# Patient Record
Sex: Female | Born: 1955 | Race: White | Hispanic: No | Smoking: Never smoker
Health system: Southern US, Community
[De-identification: ages and names within clinical notes are randomized; demographics above are authoritative.]

## PROBLEM LIST (undated history)

## (undated) DIAGNOSIS — M797 Fibromyalgia: Secondary | ICD-10-CM

## (undated) DIAGNOSIS — E785 Hyperlipidemia, unspecified: Secondary | ICD-10-CM

## (undated) DIAGNOSIS — I6521 Occlusion and stenosis of right carotid artery: Secondary | ICD-10-CM

## (undated) DIAGNOSIS — J309 Allergic rhinitis, unspecified: Secondary | ICD-10-CM

## (undated) DIAGNOSIS — K2970 Gastritis, unspecified, without bleeding: Secondary | ICD-10-CM

## (undated) DIAGNOSIS — M790 Rheumatism, unspecified: Secondary | ICD-10-CM

## (undated) DIAGNOSIS — N3010 Interstitial cystitis (chronic) without hematuria: Secondary | ICD-10-CM

## (undated) DIAGNOSIS — I319 Disease of pericardium, unspecified: Secondary | ICD-10-CM

## (undated) DIAGNOSIS — F419 Anxiety disorder, unspecified: Secondary | ICD-10-CM

## (undated) DIAGNOSIS — I341 Nonrheumatic mitral (valve) prolapse: Secondary | ICD-10-CM

## (undated) HISTORY — PX: CARDIAC CATHETERIZATION: SHX172

## (undated) HISTORY — DX: Anxiety disorder, unspecified: F41.9

## (undated) HISTORY — DX: Rheumatism, unspecified: M79.0

## (undated) HISTORY — DX: Allergic rhinitis, unspecified: J30.9

## (undated) HISTORY — DX: Occlusion and stenosis of right carotid artery: I65.21

## (undated) HISTORY — PX: ABDOMINAL HYSTERECTOMY: SHX81

---

## 1998-06-30 ENCOUNTER — Encounter: Payer: Self-pay | Admitting: Cardiovascular Disease

## 1998-06-30 ENCOUNTER — Inpatient Hospital Stay (HOSPITAL_COMMUNITY): Admission: EM | Admit: 1998-06-30 | Discharge: 1998-07-04 | Payer: Self-pay | Admitting: Emergency Medicine

## 1998-07-01 ENCOUNTER — Encounter: Payer: Self-pay | Admitting: Cardiovascular Disease

## 1998-07-02 ENCOUNTER — Encounter: Payer: Self-pay | Admitting: Cardiology

## 1998-07-03 ENCOUNTER — Encounter: Payer: Self-pay | Admitting: Internal Medicine

## 1998-09-16 ENCOUNTER — Encounter: Payer: Self-pay | Admitting: Internal Medicine

## 1998-09-16 ENCOUNTER — Emergency Department (HOSPITAL_COMMUNITY): Admission: EM | Admit: 1998-09-16 | Discharge: 1998-09-16 | Payer: Self-pay | Admitting: Emergency Medicine

## 1998-11-05 ENCOUNTER — Other Ambulatory Visit: Admission: RE | Admit: 1998-11-05 | Discharge: 1998-11-05 | Payer: Self-pay | Admitting: *Deleted

## 2000-09-23 ENCOUNTER — Other Ambulatory Visit: Admission: RE | Admit: 2000-09-23 | Discharge: 2000-09-23 | Payer: Self-pay | Admitting: Obstetrics and Gynecology

## 2001-02-17 ENCOUNTER — Ambulatory Visit (HOSPITAL_COMMUNITY): Admission: RE | Admit: 2001-02-17 | Discharge: 2001-02-17 | Payer: Self-pay | Admitting: Urology

## 2001-02-17 ENCOUNTER — Encounter (INDEPENDENT_AMBULATORY_CARE_PROVIDER_SITE_OTHER): Payer: Self-pay | Admitting: Specialist

## 2001-03-27 ENCOUNTER — Observation Stay (HOSPITAL_COMMUNITY): Admission: EM | Admit: 2001-03-27 | Discharge: 2001-03-28 | Payer: Self-pay | Admitting: Emergency Medicine

## 2002-05-09 ENCOUNTER — Ambulatory Visit (HOSPITAL_COMMUNITY): Admission: RE | Admit: 2002-05-09 | Discharge: 2002-05-09 | Payer: Self-pay | Admitting: *Deleted

## 2002-05-09 ENCOUNTER — Encounter: Payer: Self-pay | Admitting: *Deleted

## 2002-10-10 ENCOUNTER — Emergency Department (HOSPITAL_COMMUNITY): Admission: EM | Admit: 2002-10-10 | Discharge: 2002-10-10 | Payer: Self-pay | Admitting: Emergency Medicine

## 2002-10-10 ENCOUNTER — Encounter: Payer: Self-pay | Admitting: Emergency Medicine

## 2004-09-24 ENCOUNTER — Emergency Department (HOSPITAL_COMMUNITY): Admission: EM | Admit: 2004-09-24 | Discharge: 2004-09-24 | Payer: Self-pay | Admitting: Family Medicine

## 2005-10-27 ENCOUNTER — Emergency Department (HOSPITAL_COMMUNITY): Admission: EM | Admit: 2005-10-27 | Discharge: 2005-10-27 | Payer: Self-pay | Admitting: Emergency Medicine

## 2006-05-27 ENCOUNTER — Emergency Department (HOSPITAL_COMMUNITY): Admission: EM | Admit: 2006-05-27 | Discharge: 2006-05-27 | Payer: Self-pay | Admitting: Emergency Medicine

## 2006-11-07 ENCOUNTER — Emergency Department (HOSPITAL_COMMUNITY): Admission: EM | Admit: 2006-11-07 | Discharge: 2006-11-07 | Payer: Self-pay | Admitting: Emergency Medicine

## 2007-09-12 ENCOUNTER — Emergency Department (HOSPITAL_COMMUNITY): Admission: EM | Admit: 2007-09-12 | Discharge: 2007-09-12 | Payer: Self-pay | Admitting: Emergency Medicine

## 2008-01-01 ENCOUNTER — Emergency Department (HOSPITAL_COMMUNITY): Admission: EM | Admit: 2008-01-01 | Discharge: 2008-01-01 | Payer: Self-pay | Admitting: Emergency Medicine

## 2010-03-14 ENCOUNTER — Emergency Department (HOSPITAL_COMMUNITY): Payer: Self-pay

## 2010-03-14 ENCOUNTER — Encounter (HOSPITAL_COMMUNITY): Payer: Self-pay | Admitting: Radiology

## 2010-03-14 ENCOUNTER — Emergency Department (HOSPITAL_COMMUNITY)
Admission: EM | Admit: 2010-03-14 | Discharge: 2010-03-14 | Disposition: A | Discharge status: Home / Self Care | Payer: Self-pay | Attending: Emergency Medicine | Admitting: Emergency Medicine

## 2010-03-14 DIAGNOSIS — R109 Unspecified abdominal pain: Secondary | ICD-10-CM | POA: Insufficient documentation

## 2010-03-14 DIAGNOSIS — IMO0001 Reserved for inherently not codable concepts without codable children: Secondary | ICD-10-CM | POA: Insufficient documentation

## 2010-03-14 DIAGNOSIS — N3010 Interstitial cystitis (chronic) without hematuria: Secondary | ICD-10-CM | POA: Insufficient documentation

## 2010-03-14 DIAGNOSIS — R195 Other fecal abnormalities: Secondary | ICD-10-CM | POA: Insufficient documentation

## 2010-03-14 DIAGNOSIS — R300 Dysuria: Secondary | ICD-10-CM | POA: Insufficient documentation

## 2010-03-14 DIAGNOSIS — N12 Tubulo-interstitial nephritis, not specified as acute or chronic: Secondary | ICD-10-CM | POA: Insufficient documentation

## 2010-03-14 DIAGNOSIS — R110 Nausea: Secondary | ICD-10-CM | POA: Insufficient documentation

## 2010-03-14 DIAGNOSIS — K589 Irritable bowel syndrome without diarrhea: Secondary | ICD-10-CM | POA: Insufficient documentation

## 2010-03-14 LAB — CBC
MCH: 28.7 pg (ref 26.0–34.0)
MCHC: 33.3 g/dL (ref 30.0–36.0)
Platelets: 368 10*3/uL (ref 150–400)
RBC: 4.64 MIL/uL (ref 3.87–5.11)

## 2010-03-14 LAB — URINALYSIS, ROUTINE W REFLEX MICROSCOPIC
Bilirubin Urine: NEGATIVE
Nitrite: POSITIVE — AB
Specific Gravity, Urine: 1.017 (ref 1.005–1.030)
Urine Glucose, Fasting: NEGATIVE mg/dL
pH: 7 (ref 5.0–8.0)

## 2010-03-14 LAB — COMPREHENSIVE METABOLIC PANEL
ALT: 15 U/L (ref 0–35)
AST: 25 U/L (ref 0–37)
Albumin: 4.2 g/dL (ref 3.5–5.2)
CO2: 25 mEq/L (ref 19–32)
Calcium: 9.3 mg/dL (ref 8.4–10.5)
Sodium: 140 mEq/L (ref 135–145)
Total Protein: 7.4 g/dL (ref 6.0–8.3)

## 2010-03-14 LAB — URINE MICROSCOPIC-ADD ON

## 2010-03-14 LAB — DIFFERENTIAL
Basophils Relative: 1 % (ref 0–1)
Eosinophils Absolute: 0.2 10*3/uL (ref 0.0–0.7)
Monocytes Relative: 5 % (ref 3–12)
Neutrophils Relative %: 60 % (ref 43–77)

## 2010-03-14 LAB — LIPASE, BLOOD: Lipase: 29 U/L (ref 11–59)

## 2010-03-14 LAB — LACTIC ACID, PLASMA: Lactic Acid, Venous: 0.6 mmol/L (ref 0.5–2.2)

## 2010-03-14 MED ORDER — IOHEXOL 300 MG/ML  SOLN: 100.0000 mL | Freq: Once | INTRAMUSCULAR | Status: DC | PRN

## 2010-06-28 NOTE — Op Note (Signed)
[facility]  Patient:    Teresa Mcdaniel, Teresa Mcdaniel Visit Number: [id number] MRN: [id number]          Service Type: DSU Location: DAY Attending Physician:  Londell Moh Dictated by:   Jamison Neighbor, M.D. Proc. Date: 02/17/01 Admit Date:  02/17/2001   CC:         Meredith Staggers, M.D.  Vanessa P. Pennie Rushing, M.D.   Operative Report  SERVICE:  Urology.  PREOPERATIVE DIAGNOSES:  Chronic pelvic pain consistent with interstitial cystitis.  POSTOPERATIVE DIAGNOSES:  Chronic pelvic pain consistent with interstitial cystitis.  PROCEDURE:  Cystoscopy, urethral calibration, hydrodistention, Marcaine and Pyridium installation, Marcaine and Kenalog injection, bladder biopsy.  SURGEON:  Jamison Neighbor, M.D.  ANESTHESIA:  General.  COMPLICATIONS:  None.  DRAINS:  None.  BRIEF HISTORY:  This 55 year old female has been under the care of Dr. ______ for management of interstitial cystitis. The patient has also seen Dr. Bjorn Pippin. The patient has terrible periurethral pain and pelvic pain. She describes bleeding from the urethra. She seemed to have a flare-up after a complete gynecologic evaluation. She has put on an OLeary-Sant ______ score which was markedly elevated suggesting the patient may indeed have IC. The patient was scheduled for further evaluation with the plan that the patient could have a routinely scheduled hydrodistention. The patient developed increasing pain over the past week or so and has requested the hydrodistention be done on a more urgent basis. The patient has had improvement in her symptoms in the past with treatment of this nature. She realizes there is no guarantee that she will have any improvement with the hydrodistention. She describes such severe pain and she would like to go ahead and have this done and she gave full and informed consent.  DESCRIPTION OF PROCEDURE:  After successful induction of general anesthesia, the  patient was placed in the dorsal lithotomy position, prepped with Betadine and draped in the usual sterile fashion. Careful bimanual examination revealed no abnormalities of the urethra and no real cystocele, rectocele or enterocele. There were no masses on bimanual exam. The urethra was calibrated at 49 [place] with female urethral sounds with no signs of stenosis or stricture. The cystoscope was inserted, the bladder was carefully inspected and was free of any tumor or stones. Both ureteral orifices were normal in configuration and location. The patient was distended at a pressure of 100 cm of water for five minutes when the bladder was drained. The patient had a normal bladder capacity of 1150 cc. There were no glomerulations and no terminal blood tinge and it was felt this had a normal appearance. It is very possible that the patients appearance may have improved markedly due to the use of Elmiron. A biopsy was performed. The biopsy site was cauterized and a mixture of Marcaine and Pyridium was left within the bladder. Marcaine and Kenalog were injected periurethrally. A B&O suppository was inserted and she was given  Toradol and Zofran. The patients had problems with NSAIDs in the past but only when taken by mouth as it primarily causes GI upset and for that reason it was felt that it would be safe to give her Toradol. The patient will be sent home with Lorcet Plus. She already has urised and she will be given Cipro for possibly urethritis. The patient will be advised that this is a normal appearing ultrasound and review of old records by previous physicians will need to be done in order to determine if she  really does truly have interstitial cystitis. Dictated by:   Jamison Neighbor, M.D. Attending Physician:  Londell Moh DD:  02/17/01 TD:  02/17/01 Job: [id number]

## 2010-11-08 LAB — COMPREHENSIVE METABOLIC PANEL
ALT: 26
AST: 22
Albumin: 4.2
Alkaline Phosphatase: 91
BUN: 5 — ABNORMAL LOW
Chloride: 109
Potassium: 3.3 — ABNORMAL LOW
Sodium: 140
Total Protein: 6.9

## 2010-11-08 LAB — DIFFERENTIAL
Eosinophils Absolute: 0.3
Lymphs Abs: 5 — ABNORMAL HIGH
Monocytes Absolute: 0.5
Monocytes Relative: 5
Neutrophils Relative %: 39 — ABNORMAL LOW

## 2010-11-08 LAB — POCT CARDIAC MARKERS
CKMB, poc: <1 — ABNORMAL LOW
Myoglobin, poc: 54.8
Troponin i, poc: <0.05

## 2010-11-08 LAB — CBC
MCHC: 33.5
Platelets: 348
RDW: 12.5

## 2010-11-08 LAB — LIPASE, BLOOD: Lipase: 17

## 2010-11-21 LAB — DIFFERENTIAL
Basophils Absolute: 0
Lymphocytes Relative: 29
Monocytes Absolute: 0.4
Monocytes Relative: 4
Neutro Abs: 7.4

## 2010-11-21 LAB — URINALYSIS, ROUTINE W REFLEX MICROSCOPIC
Glucose, UA: NEGATIVE
Hgb urine dipstick: NEGATIVE
Ketones, ur: NEGATIVE
Protein, ur: NEGATIVE

## 2010-11-21 LAB — CBC
HCT: 37.9
MCV: 85.9
Platelets: 386
RDW: 13.3

## 2010-11-21 LAB — COMPREHENSIVE METABOLIC PANEL
AST: 24
Albumin: 3.8
Calcium: 9.3
Creatinine, Ser: 0.62
Total Protein: 6.8

## 2012-05-01 ENCOUNTER — Emergency Department (HOSPITAL_COMMUNITY)
Admission: EM | Admit: 2012-05-01 | Discharge: 2012-05-02 | Disposition: A | Discharge status: Home / Self Care | Payer: Self-pay | Attending: Emergency Medicine | Admitting: Emergency Medicine

## 2012-05-01 ENCOUNTER — Encounter (HOSPITAL_COMMUNITY): Payer: Self-pay | Admitting: Nurse Practitioner

## 2012-05-01 ENCOUNTER — Emergency Department (HOSPITAL_COMMUNITY): Payer: Self-pay

## 2012-05-01 DIAGNOSIS — Z23 Encounter for immunization: Secondary | ICD-10-CM | POA: Insufficient documentation

## 2012-05-01 DIAGNOSIS — S91009A Unspecified open wound, unspecified ankle, initial encounter: Secondary | ICD-10-CM | POA: Insufficient documentation

## 2012-05-01 DIAGNOSIS — W540XXA Bitten by dog, initial encounter: Secondary | ICD-10-CM | POA: Insufficient documentation

## 2012-05-01 DIAGNOSIS — S31109A Unspecified open wound of abdominal wall, unspecified quadrant without penetration into peritoneal cavity, initial encounter: Secondary | ICD-10-CM | POA: Insufficient documentation

## 2012-05-01 DIAGNOSIS — Y92009 Unspecified place in unspecified non-institutional (private) residence as the place of occurrence of the external cause: Secondary | ICD-10-CM | POA: Insufficient documentation

## 2012-05-01 DIAGNOSIS — Z8679 Personal history of other diseases of the circulatory system: Secondary | ICD-10-CM | POA: Insufficient documentation

## 2012-05-01 DIAGNOSIS — S81009A Unspecified open wound, unspecified knee, initial encounter: Secondary | ICD-10-CM | POA: Insufficient documentation

## 2012-05-01 DIAGNOSIS — Y9389 Activity, other specified: Secondary | ICD-10-CM | POA: Insufficient documentation

## 2012-05-01 HISTORY — DX: Nonrheumatic mitral (valve) prolapse: I34.1

## 2012-05-01 LAB — COMPREHENSIVE METABOLIC PANEL
ALT: 21 U/L (ref 0–35)
Alkaline Phosphatase: 82 U/L (ref 39–117)
CO2: 27 mEq/L (ref 19–32)
Chloride: 105 mEq/L (ref 96–112)
GFR calc Af Amer: >90 mL/min (ref >90)
GFR calc non Af Amer: >90 mL/min (ref >90)
Glucose, Bld: 122 mg/dL — ABNORMAL HIGH (ref 70–99)
Potassium: 3.6 mEq/L (ref 3.5–5.1)
Sodium: 145 mEq/L (ref 135–145)
Total Protein: 7.3 g/dL (ref 6.0–8.3)

## 2012-05-01 LAB — CBC WITH DIFFERENTIAL/PLATELET
Lymphocytes Relative: 34 % (ref 12–46)
Lymphs Abs: 4.2 10*3/uL — ABNORMAL HIGH (ref 0.7–4.0)
Neutro Abs: 7.5 10*3/uL (ref 1.7–7.7)
Neutrophils Relative %: 60 % (ref 43–77)
Platelets: 356 10*3/uL (ref 150–400)
RBC: 4.49 MIL/uL (ref 3.87–5.11)
WBC: 12.6 10*3/uL — ABNORMAL HIGH (ref 4.0–10.5)

## 2012-05-01 MED ORDER — AMOXICILLIN-POT CLAVULANATE 875-125 MG PO TABS: 1.0000 | ORAL_TABLET | Freq: Two times a day (BID) | ORAL | Status: DC

## 2012-05-01 MED ORDER — IOHEXOL 300 MG/ML  SOLN
80.0000 mL | Freq: Once | INTRAMUSCULAR | Status: AC | PRN
  Administered 2012-05-01: 80 mL via INTRAVENOUS

## 2012-05-01 MED ORDER — BACITRACIN 500 UNIT/GM EX OINT
1.0000 "application " | TOPICAL_OINTMENT | Freq: Two times a day (BID) | CUTANEOUS | Status: DC
  Filled 2012-05-01: qty 0.9

## 2012-05-01 MED ORDER — TETANUS-DIPHTH-ACELL PERTUSSIS 5-2.5-18.5 LF-MCG/0.5 IM SUSP
0.5000 mL | Freq: Once | INTRAMUSCULAR | Status: AC
  Administered 2012-05-01: 0.5 mL via INTRAMUSCULAR
  Filled 2012-05-01: qty 0.5

## 2012-05-01 MED ORDER — HYDROCODONE-ACETAMINOPHEN 5-325 MG PO TABS: 1.0000 | ORAL_TABLET | Freq: Four times a day (QID) | ORAL | Status: DC | PRN

## 2012-05-01 MED ORDER — ONDANSETRON 4 MG PO TBDP
4.0000 mg | ORAL_TABLET | Freq: Once | ORAL | Status: AC
Start: 2012-05-01 — End: 2012-05-01
  Administered 2012-05-01: 4 mg via ORAL
  Filled 2012-05-01: qty 1

## 2012-05-01 MED ORDER — MORPHINE SULFATE 4 MG/ML IJ SOLN
4.0000 mg | Freq: Once | INTRAMUSCULAR | Status: AC
  Administered 2012-05-01: 4 mg via INTRAVENOUS
  Filled 2012-05-01: qty 1

## 2012-05-01 MED ORDER — LORAZEPAM 1 MG PO TABS: 0.5000 mg | ORAL_TABLET | Freq: Four times a day (QID) | ORAL | Status: DC | PRN

## 2012-05-01 NOTE — ED Provider Notes (Signed)
History  This chart was scribed for non-physician practitioner Arthor Captain, PA-C working with Richardean Canal, MD, by Candelaria Stagers, ED Scribe. This patient was seen in room TR09C/TR09C and the patient's care was started at 7:45 PM   CSN: [id number]  Arrival date & time 05/01/12  4098   First MD Initiated Contact with Patient 05/01/12 1838      Chief Complaint  Patient presents with  . Animal Bite    The history is provided by the patient. No language interpreter was used.   Teresa Mcdaniel is a 57 y.o. female who presents to the Emergency Department complaining of a dog bite to her LLQ and left posterior calf after being surrounded by three dogs at a friends house about three hours ago.  She is experiencing associated abdominal pain and bruising.  The dogs are owned by a friend and she is unsure of their vaccines.  Tetanus is not up to date.  Pt has been applying ice and has the area covered.  Animal control has been contacted.  Bleeding is controlled.    Past Medical History  Diagnosis Date  . Mitral valve prolapse     History reviewed. No pertinent past surgical history.  History reviewed. No pertinent family history.  History  Substance Use Topics  . Smoking status: Never Smoker   . Smokeless tobacco: Not on file  . Alcohol Use: No    OB History   Grav Para Term Preterm Abortions TAB SAB Ect Mult Living                  Review of Systems  Gastrointestinal: Positive for abdominal pain.  Skin: Positive for wound (animal bite to LLQ and left posterior calf).  All other systems reviewed and are negative.    Allergies  Amoxicillin; Nsaids; and Sulfa antibiotics  Home Medications   Current Outpatient Rx  Name  Route  Sig  Dispense  Refill  . naproxen sodium (ANAPROX) 220 MG tablet   Oral   Take 220 mg by mouth daily as needed (for pain).           BP 143/103  Pulse 110  Temp(Src) 98.1 F (36.7 C) (Oral)  Resp 16  SpO2 98%  Physical Exam  Nursing note  and vitals reviewed. Constitutional: She is oriented to person, place, and time. She appears well-developed and well-nourished. No distress.  HENT:  Head: Normocephalic and atraumatic.  Eyes: EOM are normal.  Neck: Neck supple. No tracheal deviation present.  Cardiovascular: Normal rate.   Pulmonary/Chest: Effort normal. No respiratory distress.  Abdominal:  2 small puncture wounds and abrasions to LLQ with ecchymosis, swelling, and rebound.  Exquisite tenderness to palpation of LLQ.  Peritoneal signs to left heel strike.   Musculoskeletal: Normal range of motion.  One small puncture wound to posterior left calf.   Neurological: She is alert and oriented to person, place, and time.  Skin: Skin is warm and dry.  Psychiatric: She has a normal mood and affect. Her behavior is normal.    ED Course  Procedures  DIAGNOSTIC STUDIES: Oxygen Saturation is 98% on room air, normal by my interpretation.    COORDINATION OF CARE:  7:48 PM Discussed course of care with pt which includes cleaning wound.  Pt understands and agrees.   8:02 PM Consult with  Richardean Canal, MD.  Will order CT abdomen.  11:19 PM Discussed CT with pt.  Discussed need for rabies series only if animal control reports  indicate so.  Pt understands and agrees.  Cleaned the wounds with copious amounts of saline solution and applied clean dry dressing.    Labs Reviewed  CBC WITH DIFFERENTIAL - Abnormal; Notable for the following:    WBC 12.6 (*)    Lymphs Abs 4.2 (*)    All other components within normal limits  COMPREHENSIVE METABOLIC PANEL - Abnormal; Notable for the following:    Glucose, Bld 122 (*)    Total Bilirubin 0.2 (*)    All other components within normal limits   Ct Abdomen Pelvis W Contrast  05/01/2012  *RADIOLOGY REPORT*  Clinical Data: 57 year old female with abdominal/pelvic injury with dog bite.  CT ABDOMEN AND PELVIS WITH CONTRAST  Technique:  Multidetector CT imaging of the abdomen and pelvis was  performed following the standard protocol during bolus administration of intravenous contrast.  Contrast: 80mL OMNIPAQUE IOHEXOL 300 MG/ML  SOLN  Comparison: 03/14/2010 CT  Findings: Subcutaneous inflammation and gas within the anterior left pelvis is compatible with dog bite by history.  There is no evidence of intra-abdominal or intrapelvic extension of this inflammation or gas.  The liver, gallbladder, adrenal glands, kidneys, pancreas and spleen are unremarkable.  No free fluid, enlarged lymph nodes, biliary dilation or abdominal aortic aneurysm identified.  The bowel and bladder are unremarkable. The patient is status post hysterectomy.  No acute or suspicious bony abnormalities are identified.  IMPRESSION: Subcutaneous inflammation and gas within the anterior left pelvis compatible with dog bite by history.  No evidence of intra- abdominal or intrapelvic abnormality.   Original Report Authenticated By: Harmon Pier, M.D.      1. Bite from dog, initial encounter       MDM  Patient with dog bite and exquisite abdominal tenderness. I have ordered CT to r/o bowel injury. Pain control initiated.  Dr. Silverio Lay notified and agrees. Patient seen in shared visit.  11:00 PM Filed Vitals:   05/01/12 1832 05/02/12 0054  BP: 143/103 126/76  Pulse: 110 72  Temp: 98.1 F (36.7 C) 97.1 F (36.2 C)  TempSrc: Oral Oral  Resp: 16 18  SpO2: 98% 95%   patien t CT scan negative for any intrabdominal injury. Superficial puncture sites have been flussed thoroughly with sterile saline.  No signs of compartment syndrome in teh left calf.  Dog owners contacted and animals up to date on  Vaccinations. Animal control notified, Tdap updated.  Will cover for infection with augmentin. Return precautions discussed. Will discharge with anxiolytic and pain meds.  I personally performed the services described in this documentation, which was scribed in my presence. The recorded information has been reviewed and is  accurate.          Arthor Captain, PA-C 05/02/12 [phone]

## 2012-05-01 NOTE — ED Notes (Signed)
Pt anxious, pt states, "I wish someone will listen to my heart, because I screamed for about 3 mins before the owners came & got me."

## 2012-05-01 NOTE — ED Notes (Signed)
Called animal control but no response as its out of office hours.

## 2012-05-01 NOTE — ED Notes (Signed)
Pt reports a dog ran up and bit her LLQ and L posterior calf. Bleeding stopped now. C/o severe pain at site. Has been applying ice to abd. Dog is owned by a family friend and remains with the owner at this time. Animal control has not been contacted.

## 2012-05-02 MED ORDER — BACITRACIN ZINC 500 UNIT/GM EX OINT
TOPICAL_OINTMENT | Freq: Two times a day (BID) | CUTANEOUS | Status: DC
  Administered 2012-05-02: 01:00:00 via TOPICAL
  Filled 2012-05-02: qty 15

## 2012-05-02 MED ORDER — ACETAMINOPHEN 325 MG PO TABS
650.0000 mg | ORAL_TABLET | Freq: Once | ORAL | Status: AC
  Administered 2012-05-02: 650 mg via ORAL
  Filled 2012-05-02: qty 2

## 2012-05-02 NOTE — ED Notes (Signed)
Pt discharged.Vital signs stable and GCS 15 

## 2012-05-02 NOTE — ED Provider Notes (Signed)
Medical screening examination/treatment/procedure(s) were conducted as a shared visit with non-physician practitioner(s) and myself.  I personally evaluated the patient during the encounter  Teresa Mcdaniel is a 57 y.o. female here with dog bite. Bit by neighbor's dog. Tetanus updated in the ED. She was bitten on L leg and L sided lower abdomen. Mildly tender LLQ but the wound appeared superficial. CT ab/pel showed no abscess. She is d/c home on augmentin. Animal control notified by PA.    Richardean Canal, MD 05/02/12 [phone]

## 2012-05-03 ENCOUNTER — Telehealth (HOSPITAL_COMMUNITY): Payer: Self-pay | Admitting: *Deleted

## 2012-05-03 NOTE — ED Notes (Signed)
Patient called wanting clarification on medication, Current scriber informed her that abx given was Augmentin.Patient felt relieved.

## 2012-05-17 ENCOUNTER — Emergency Department (HOSPITAL_COMMUNITY)
Admission: EM | Admit: 2012-05-17 | Discharge: 2012-05-17 | Disposition: A | Discharge status: Home / Self Care | Payer: Self-pay | Attending: Emergency Medicine | Admitting: Emergency Medicine

## 2012-05-17 ENCOUNTER — Encounter (HOSPITAL_COMMUNITY): Payer: Self-pay | Admitting: Nurse Practitioner

## 2012-05-17 ENCOUNTER — Emergency Department (HOSPITAL_COMMUNITY): Payer: Self-pay

## 2012-05-17 DIAGNOSIS — Z4801 Encounter for change or removal of surgical wound dressing: Secondary | ICD-10-CM | POA: Insufficient documentation

## 2012-05-17 DIAGNOSIS — Z79899 Other long term (current) drug therapy: Secondary | ICD-10-CM | POA: Insufficient documentation

## 2012-05-17 DIAGNOSIS — Z8719 Personal history of other diseases of the digestive system: Secondary | ICD-10-CM | POA: Insufficient documentation

## 2012-05-17 DIAGNOSIS — R1032 Left lower quadrant pain: Secondary | ICD-10-CM | POA: Insufficient documentation

## 2012-05-17 DIAGNOSIS — Z8679 Personal history of other diseases of the circulatory system: Secondary | ICD-10-CM | POA: Insufficient documentation

## 2012-05-17 DIAGNOSIS — Z5189 Encounter for other specified aftercare: Secondary | ICD-10-CM

## 2012-05-17 DIAGNOSIS — R110 Nausea: Secondary | ICD-10-CM | POA: Insufficient documentation

## 2012-05-17 HISTORY — DX: Interstitial cystitis (chronic) without hematuria: N30.10

## 2012-05-17 LAB — CBC WITH DIFFERENTIAL/PLATELET
Basophils Absolute: 0 10*3/uL (ref 0.0–0.1)
Basophils Relative: 0 % (ref 0–1)
HCT: 37.3 % (ref 36.0–46.0)
MCHC: 34.3 g/dL (ref 30.0–36.0)
Monocytes Absolute: 0.4 10*3/uL (ref 0.1–1.0)
Neutro Abs: 4.9 10*3/uL (ref 1.7–7.7)
RDW: 13.4 % (ref 11.5–15.5)

## 2012-05-17 MED ORDER — ONDANSETRON 4 MG PO TBDP: 4.0000 mg | ORAL_TABLET | Freq: Three times a day (TID) | ORAL | Status: DC | PRN

## 2012-05-17 MED ORDER — TRAMADOL HCL 50 MG PO TABS: 50.0000 mg | ORAL_TABLET | Freq: Four times a day (QID) | ORAL | Status: DC | PRN

## 2012-05-17 MED ORDER — IOHEXOL 300 MG/ML  SOLN
100.0000 mL | Freq: Once | INTRAMUSCULAR | Status: AC | PRN
  Administered 2012-05-17: 80 mL via INTRAVENOUS

## 2012-05-17 NOTE — ED Notes (Signed)
Pt reports dog bite to abd on 3/22, reports she took a course of oral antibiotics and has been cleaning with bacitracin but is having increasing pain and swelling at site since.

## 2012-05-17 NOTE — ED Notes (Signed)
Patient is alert and orientedx4.  Patient was explained discharge instructions and they understood them with no questions.   

## 2012-05-17 NOTE — ED Notes (Signed)
Patient is resting comfortably. Requested water and it was given to her.

## 2012-05-17 NOTE — ED Provider Notes (Signed)
History     CSN: [id number]  Arrival date & time 05/17/12  1435   First MD Initiated Contact with Patient 05/17/12 1458      Chief Complaint  Patient presents with  . Wound Check    (Consider location/radiation/quality/duration/timing/severity/associated sxs/prior treatment) HPI Comments: Pt presenting to ED for wound check from initial dog bite on 05/01/12.  Has completed her course of augmentin and has been cleaning the wounds daily and applying bacitracin.  States she continues to have pain at the puncture sites and some intermittent nausea.  Has not taken any of her pain medications since initial injury.  Pain is worse with movement or lifting heavy objects.  Patient is a 57 y.o. female presenting with wound check. The history is provided by the patient.  Wound Check    Past Medical History  Diagnosis Date  . Mitral valve prolapse   . Interstitial cystitis     History reviewed. No pertinent past surgical history.  History reviewed. No pertinent family history.  History  Substance Use Topics  . Smoking status: Never Smoker   . Smokeless tobacco: Not on file  . Alcohol Use: No    OB History   Grav Para Term Preterm Abortions TAB SAB Ect Mult Living                  Review of Systems  Skin: Positive for wound.  All other systems reviewed and are negative.    Allergies  Nsaids; Tetracyclines & related; and Sulfa antibiotics  Home Medications   Current Outpatient Rx  Name  Route  Sig  Dispense  Refill  . amoxicillin-clavulanate (AUGMENTIN) 875-125 MG per tablet   Oral   Take 1 tablet by mouth every 12 (twelve) hours.   14 tablet   0   . HYDROcodone-acetaminophen (NORCO) 5-325 MG per tablet   Oral   Take 1-2 tablets by mouth every 6 (six) hours as needed for pain.   20 tablet   0   . LORazepam (ATIVAN) 1 MG tablet   Oral   Take 0.5-1 tablets (0.5-1 mg total) by mouth every 6 (six) hours as needed for anxiety.   15 tablet   0   . naproxen sodium  (ANAPROX) 220 MG tablet   Oral   Take 220 mg by mouth daily as needed (for pain).           BP 121/74  Pulse 66  Temp(Src) 98.4 F (36.9 C) (Oral)  Resp 16  SpO2 97%  Physical Exam  Nursing note and vitals reviewed. Constitutional: She is oriented to person, place, and time. She appears well-developed and well-nourished.  HENT:  Head: Normocephalic and atraumatic.  Mouth/Throat: Oropharynx is clear and moist.  Eyes: Conjunctivae and EOM are normal. Pupils are equal, round, and reactive to light.  Neck: Normal range of motion.  Cardiovascular: Normal rate, regular rhythm and normal heart sounds.   Pulmonary/Chest: Effort normal and breath sounds normal.  Abdominal: Soft. Bowel sounds are normal.    Puncture wounds healing nicely without surrounding swelling, bruising, erythema or evidence of cellulitis  Musculoskeletal:       Legs: Puncture wound healing nicely, no surrounding swelling, erythema, bruising, or evidence of cellulitis  Neurological: She is alert and oriented to person, place, and time.  Skin: Skin is warm and dry.  Psychiatric: She has a normal mood and affect.    ED Course  Procedures (including critical care time)  Labs Reviewed  CBC WITH DIFFERENTIAL -  Abnormal; Notable for the following:    Lymphocytes Relative 47 (*)    Lymphs Abs 4.9 (*)    All other components within normal limits   Ct Abdomen Pelvis W Contrast  05/17/2012  *RADIOLOGY REPORT*  Clinical Data: Dog bite to the left lower quadrant abdominal wall on 05/01/2012.  Increasing pain and swelling at the site of injury.  CT ABDOMEN AND PELVIS WITH CONTRAST  Technique:  Multidetector CT imaging of the abdomen and pelvis was performed following the standard protocol during bolus administration of intravenous contrast.  Contrast: 80mL OMNIPAQUE IOHEXOL 300 MG/ML SOLN  Comparison: Prior CT on 05/01/2012.  Findings: At the level of injury, subcutaneous air has resolved. There remains some mild  inflammatory stranding in the subcutaneous fat of the left lower quadrant.  No foreign body is identified. There is no evidence of focal abscess.  The rest of the study demonstrates stable appearance to the abdominal and pelvic contents without evidence of acute abnormality.  Bowel remains normal.  No free fluid is identified. No evidence of hernia.  IMPRESSION: By CT, the subcutaneous injury at the level of the left lower quadrant dog bite appears improved with resolution of subcutaneous gas.  Mild stranding remains present in the subcutaneous fat.  No focal abscess or soft tissue foreign body is identified.   Original Report Authenticated By: Irish Lack, M.D.      1. Visit for wound check       MDM   Pt presents for wound check following dog bite on 3/22.  Completed course of augmentin but continues to have pain at the puncture sites and intermittent nausea.  Has not taken any of her pain medications since initial encounter.  Wounds are healing nicely without surrounding erythema, swelling, bruising, or evidence of cellulitis.  Pt is still concerned that she has gross infection- would like blood work done.  I will obtain CBC.  CBC WNL.  Pt still requesting additional evaluation.  Evaluated by Dr. Manus Gunning.  CT obtained at pts request- again negative for acute processes.  Rx tramadol and zofran.  Continue with wound care. Return precautions advised.       Garlon Hatchet, PA-C 05/18/12 1132

## 2012-05-17 NOTE — ED Notes (Signed)
Wounds on abdomen and left lower leg appear to be well healed. Pt c/o continued abd pain. States feels nausea and chills at times.

## 2012-05-19 NOTE — ED Provider Notes (Signed)
Medical screening examination/treatment/procedure(s) were conducted as a shared visit with non-physician practitioner(s) and myself.  I personally evaluated the patient during the encounter  Recheck of dog bite to abdomen sustained 3/22.  Healing well. No evidence of cellulitis or abscess.  Glynn Octave, MD 05/19/12 1018

## 2012-09-23 ENCOUNTER — Observation Stay (HOSPITAL_COMMUNITY)
Admission: EM | Admit: 2012-09-23 | Discharge: 2012-09-25 | Disposition: A | Discharge status: Home / Self Care | Payer: 59 | Attending: Internal Medicine | Admitting: Internal Medicine

## 2012-09-23 ENCOUNTER — Encounter (HOSPITAL_COMMUNITY): Payer: Self-pay | Admitting: *Deleted

## 2012-09-23 ENCOUNTER — Emergency Department (HOSPITAL_COMMUNITY): Payer: Self-pay

## 2012-09-23 DIAGNOSIS — I319 Disease of pericardium, unspecified: Secondary | ICD-10-CM | POA: Diagnosis present

## 2012-09-23 DIAGNOSIS — I059 Rheumatic mitral valve disease, unspecified: Secondary | ICD-10-CM

## 2012-09-23 DIAGNOSIS — I341 Nonrheumatic mitral (valve) prolapse: Secondary | ICD-10-CM | POA: Diagnosis present

## 2012-09-23 DIAGNOSIS — R079 Chest pain, unspecified: Secondary | ICD-10-CM

## 2012-09-23 DIAGNOSIS — R6884 Jaw pain: Secondary | ICD-10-CM | POA: Insufficient documentation

## 2012-09-23 DIAGNOSIS — Z79899 Other long term (current) drug therapy: Secondary | ICD-10-CM | POA: Insufficient documentation

## 2012-09-23 DIAGNOSIS — R072 Precordial pain: Principal | ICD-10-CM | POA: Insufficient documentation

## 2012-09-23 HISTORY — DX: Disease of pericardium, unspecified: I31.9

## 2012-09-23 HISTORY — DX: Gastritis, unspecified, without bleeding: K29.70

## 2012-09-23 LAB — CBC WITH DIFFERENTIAL/PLATELET
Basophils Relative: 0 % (ref 0–1)
Eosinophils Absolute: 0.3 10*3/uL (ref 0.0–0.7)
MCH: 29.7 pg (ref 26.0–34.0)
MCHC: 34.2 g/dL (ref 30.0–36.0)
Neutrophils Relative %: 52 % (ref 43–77)
Platelets: 334 10*3/uL (ref 150–400)
RDW: 13.4 % (ref 11.5–15.5)

## 2012-09-23 LAB — POCT I-STAT, CHEM 8
Calcium, Ion: 1.2 mmol/L (ref 1.12–1.23)
Chloride: 105 mEq/L (ref 96–112)
HCT: 41 % (ref 36.0–46.0)
Hemoglobin: 13.9 g/dL (ref 12.0–15.0)
TCO2: 25 mmol/L (ref 0–100)

## 2012-09-23 LAB — URINALYSIS, ROUTINE W REFLEX MICROSCOPIC
Glucose, UA: NEGATIVE mg/dL
Hgb urine dipstick: NEGATIVE
Specific Gravity, Urine: 1.008 (ref 1.005–1.030)
Urobilinogen, UA: 0.2 mg/dL (ref 0.0–1.0)
pH: 6.5 (ref 5.0–8.0)

## 2012-09-23 LAB — URINE MICROSCOPIC-ADD ON

## 2012-09-23 LAB — POCT I-STAT TROPONIN I
Troponin i, poc: 0.01 ng/mL (ref 0.00–0.08)
Troponin i, poc: 0 ng/mL (ref 0.00–0.08)

## 2012-09-23 MED ORDER — KETOROLAC TROMETHAMINE 30 MG/ML IJ SOLN
30.0000 mg | Freq: Once | INTRAMUSCULAR | Status: AC
  Administered 2012-09-23: 30 mg via INTRAVENOUS
  Filled 2012-09-23: qty 1

## 2012-09-23 MED ORDER — NITROGLYCERIN 2 % TD OINT
1.0000 [in_us] | TOPICAL_OINTMENT | Freq: Once | TRANSDERMAL | Status: AC
  Administered 2012-09-23: 1 [in_us] via TOPICAL
  Filled 2012-09-23: qty 1

## 2012-09-23 MED ORDER — ONDANSETRON HCL 4 MG/2ML IJ SOLN
4.0000 mg | Freq: Once | INTRAMUSCULAR | Status: AC
  Administered 2012-09-23: 4 mg via INTRAVENOUS
  Filled 2012-09-23: qty 2

## 2012-09-23 MED ORDER — ACETAMINOPHEN 325 MG PO TABS
650.0000 mg | ORAL_TABLET | Freq: Once | ORAL | Status: AC
  Administered 2012-09-23: 650 mg via ORAL
  Filled 2012-09-23: qty 2

## 2012-09-23 MED ORDER — LORAZEPAM 2 MG/ML IJ SOLN
1.0000 mg | Freq: Once | INTRAMUSCULAR | Status: AC
  Administered 2012-09-23: 1 mg via INTRAVENOUS
  Filled 2012-09-23: qty 1

## 2012-09-23 MED ORDER — NITROGLYCERIN 0.4 MG SL SUBL
0.4000 mg | SUBLINGUAL_TABLET | SUBLINGUAL | Status: DC | PRN
  Administered 2012-09-23 (×2): 0.4 mg via SUBLINGUAL
  Filled 2012-09-23: qty 25

## 2012-09-23 MED ORDER — ENOXAPARIN SODIUM 80 MG/0.8ML ~~LOC~~ SOLN
1.0000 mg/kg | Freq: Two times a day (BID) | SUBCUTANEOUS | Status: DC
  Administered 2012-09-23: 70 mg via SUBCUTANEOUS
  Filled 2012-09-23 (×3): qty 0.8

## 2012-09-23 MED ORDER — ASPIRIN 81 MG PO CHEW
324.0000 mg | CHEWABLE_TABLET | Freq: Once | ORAL | Status: AC
  Administered 2012-09-23: 324 mg via ORAL
  Filled 2012-09-23: qty 4

## 2012-09-23 MED ORDER — GI COCKTAIL ~~LOC~~
30.0000 mL | Freq: Once | ORAL | Status: AC
  Administered 2012-09-23: 30 mL via ORAL
  Filled 2012-09-23: qty 30

## 2012-09-23 MED ORDER — MORPHINE SULFATE 2 MG/ML IJ SOLN
2.0000 mg | INTRAMUSCULAR | Status: DC | PRN
  Administered 2012-09-23: 2 mg via INTRAVENOUS
  Filled 2012-09-23: qty 1

## 2012-09-23 NOTE — ED Notes (Signed)
Pt with hx of mitral valve prolapse to ED c/o L jaw pain, arm pain and scapular pain for several weeks.  Last night the pain increased to where she could not ignore it.  Also c/o headache.

## 2012-09-23 NOTE — H&P (Signed)
Chief Complaint:  cp  HPI: 57 yo female with several episodes of sscp with exertion for several weeks now that would spontaneously resolve comes in with an episode today that started radiating to left jaw/neck.  She was given ntg sl in ED which relieved her pain somewhat.  Associated sob.  No n/v.  No fevers.  No le edema or swelling.  No prior h/o cad.  Her pain is down to 2/10 now, has ntg paste on.  Anxious.  Review of Systems:  Positive and negative as per HPI otherwise all other systems are negative  Past Medical History: Past Medical History  Diagnosis Date  . Mitral valve prolapse   . Interstitial cystitis   . Gastritis   . Pericarditis    Past Surgical History  Procedure Laterality Date  . Abdominal hysterectomy      Medications: Prior to Admission medications   Medication Sig Start Date End Date Taking? Authorizing Provider  Ascorbic Acid (VITAMIN C PO) Take 1 tablet by mouth daily.   Yes Historical Provider, MD  b complex vitamins tablet Take 1 tablet by mouth daily.   Yes Historical Provider, MD  Cholecalciferol (VITAMIN D-3 PO) Take 1 tablet by mouth daily.   Yes Historical Provider, MD  GARLIC PO Take 1 tablet by mouth daily.   Yes Historical Provider, MD  IRON PO Take 1 tablet by mouth daily.   Yes Historical Provider, MD  loratadine (CLARITIN) 10 MG tablet Take 10 mg by mouth daily as needed for allergies.   Yes Historical Provider, MD  naproxen sodium (ANAPROX) 220 MG tablet Take 220 mg by mouth daily as needed (for pain).   Yes Historical Provider, MD  ranitidine (ZANTAC) 150 MG tablet Take 300 mg by mouth daily.   Yes Historical Provider, MD    Allergies:   Allergies  Allergen Reactions  . Codeine Nausea Only  . Levaquin [Levofloxacin In D5w]   . Lodine [Etodolac]   . Nsaids Other (See Comments)    Stomach problems  . Tetracyclines & Related Swelling  . Sulfa Antibiotics Swelling    Social History:  reports that she has never smoked. She does not  have any smokeless tobacco history on file. She reports that she does not drink alcohol or use illicit drugs.  Family History: Father mi in 43s.  Sibling with mi in 38s.  Physical Exam: Filed Vitals:   09/23/12 2000 09/23/12 2015 09/23/12 2030 09/23/12 2045  BP: 107/66 117/69 105/51 114/70  Pulse: 65 69 67 62  Temp:      TempSrc:      Resp: 13 14 21 16   Height:      Weight:      SpO2: 96% 94% 98% 96%   General appearance: alert, cooperative and no distress Head: Normocephalic, without obvious abnormality, atraumatic Eyes: negative Nose: Nares normal. Septum midline. Mucosa normal. No drainage or sinus tenderness. Neck: no JVD and supple, symmetrical, trachea midline Lungs: clear to auscultation bilaterally Heart: regular rate and rhythm, S1, S2 normal, no murmur, click, rub or gallop Abdomen: soft, non-tender; bowel sounds normal; no masses,  no organomegaly Extremities: extremities normal, atraumatic, no cyanosis or edema Pulses: 2+ and symmetric Skin: Skin color, texture, turgor normal. No rashes or lesions Neurologic: Grossly normal  Labs on Admission:   Recent Labs  09/23/12 1618  NA 143  K 3.8  CL 105  GLUCOSE 94  BUN 9  CREATININE 0.80    Recent Labs  09/23/12 1618 09/23/12 1856  WBC  --  12.7*  NEUTROABS  --  6.7  HGB 13.9 13.0  HCT 41.0 38.0  MCV  --  87.0  PLT  --  334   Radiological Exams on Admission: Dg Chest 2 View  09/23/2012   *RADIOLOGY REPORT*  Clinical Data: Chest pain, jaw pain  CHEST - 2 VIEW  Comparison: 09/12/2007  Findings: Cardiomediastinal silhouette is stable.  No acute infiltrate or pleural effusion.  No pulmonary edema.  Mild thoracic dextroscoliosis.  IMPRESSION: No active disease.  No significant change.   Original Report Authenticated By: Natasha Mead, M.D.    Assessment/Plan  57 yo female with sscp with strong family h/o CAD Principal Problem:   Chest pain radiating to jaw Active Problems:   Mitral valve prolapse   h/o  Pericarditis  Romi.  ntg paste.  Full dose lovenox.  Morphine prn.  Asa.  Echo in am.  Needs full cardiac stress testing at some point for complete w/u.  obs on tele.  Full code.  Iver Miklas A 09/23/2012, 9:27 PM

## 2012-09-23 NOTE — ED Notes (Signed)
Phlebotomy informed we need an I-stat troponin drawn on the pt.

## 2012-09-23 NOTE — ED Notes (Signed)
Pt began to have CP again after receiving morphine. RN promoted comfort by repositioning pt and encouraging deep breathing. Pain did not resolve. PA notified.

## 2012-09-23 NOTE — ED Provider Notes (Signed)
CSN: [id number]     Arrival date & time 09/23/12  1544 History     First MD Initiated Contact with Patient 09/23/12 1754     Chief Complaint  Patient presents with  . Jaw Pain   (Consider location/radiation/quality/duration/timing/severity/associated sxs/prior Treatment) HPI Comments: Patient reports central chest pain with radiation into her left neck and left jaw and pain in her left scapula that began this afternoon at 2pm.  Has also had left sided headache. States over the past two weeks she has had intermittent central chest pain and SOB that occurs only with exertion. Pt has had unrelated intermittent abdominal pain since March since a dog attacked her.  Denies fever, chills, cough, hemoptysis, recent illness.  She notes she has had increased amount of stress.    The history is provided by the patient.    Past Medical History  Diagnosis Date  . Mitral valve prolapse   . Interstitial cystitis   . Gastritis   . Pericarditis    Past Surgical History  Procedure Laterality Date  . Abdominal hysterectomy     No family history on file. History  Substance Use Topics  . Smoking status: Never Smoker   . Smokeless tobacco: Not on file  . Alcohol Use: No   OB History   Grav Para Term Preterm Abortions TAB SAB Ect Mult Living                 Review of Systems  Constitutional: Negative for fever and chills.  Respiratory: Positive for shortness of breath. Negative for cough.   Cardiovascular: Positive for chest pain.  Gastrointestinal: Positive for abdominal pain (chronic, unchanged since March.  3 negative CTs). Negative for nausea, vomiting and diarrhea.  Genitourinary: Negative for dysuria, urgency and frequency.  All other systems reviewed and are negative.    Allergies  Codeine; Levaquin; Lodine; Nsaids; Tetracyclines & related; and Sulfa antibiotics  Home Medications   Current Outpatient Rx  Name  Route  Sig  Dispense  Refill  . Ascorbic Acid (VITAMIN C PO)  Oral   Take 1 tablet by mouth daily.         Marland Kitchen b complex vitamins tablet   Oral   Take 1 tablet by mouth daily.         . Cholecalciferol (VITAMIN D-3 PO)   Oral   Take 1 tablet by mouth daily.         Marland Kitchen GARLIC PO   Oral   Take 1 tablet by mouth daily.         . IRON PO   Oral   Take 1 tablet by mouth daily.         Marland Kitchen loratadine (CLARITIN) 10 MG tablet   Oral   Take 10 mg by mouth daily as needed for allergies.         . naproxen sodium (ANAPROX) 220 MG tablet   Oral   Take 220 mg by mouth daily as needed (for pain).         . ranitidine (ZANTAC) 150 MG tablet   Oral   Take 300 mg by mouth daily.          BP 125/68  Pulse 66  Temp(Src) 98.1 F (36.7 C) (Oral)  Resp 12  Ht 5\' 5"  (1.651 m)  Wt 157 lb (71.215 kg)  BMI 26.13 kg/m2  SpO2 100% Physical Exam  Nursing note and vitals reviewed. Constitutional: She appears well-developed and well-nourished. No distress.  HENT:  Head: Normocephalic and atraumatic.  Neck: Neck supple.  Cardiovascular: Normal rate and regular rhythm.   Pulmonary/Chest: Effort normal and breath sounds normal. No respiratory distress. She has no wheezes. She has no rales.  Abdominal: Soft. She exhibits no distension. There is tenderness in the suprapubic area. There is no rebound and no guarding.  Neurological: She is alert.  Skin: She is not diaphoretic.    ED Course   Procedures (including critical care time)  Labs Reviewed  CBC WITH DIFFERENTIAL - Abnormal; Notable for the following:    WBC 12.7 (*)    Lymphs Abs 5.1 (*)    All other components within normal limits  URINALYSIS, ROUTINE W REFLEX MICROSCOPIC - Abnormal; Notable for the following:    Leukocytes, UA TRACE (*)    All other components within normal limits  URINE MICROSCOPIC-ADD ON  TROPONIN I  BASIC METABOLIC PANEL  CBC  POCT I-STAT, CHEM 8  POCT I-STAT TROPONIN I  POCT I-STAT TROPONIN I   Dg Chest 2 View  09/23/2012   *RADIOLOGY REPORT*   Clinical Data: Chest pain, jaw pain  CHEST - 2 VIEW  Comparison: 09/12/2007  Findings: Cardiomediastinal silhouette is stable.  No acute infiltrate or pleural effusion.  No pulmonary edema.  Mild thoracic dextroscoliosis.  IMPRESSION: No active disease.  No significant change.   Original Report Authenticated By: Natasha Mead, M.D.   6:41 PM Dr Patria Mane made aware of the patient.    Date: 09/23/2012  Rate: 67  Rhythm: normal sinus rhythm  QRS Axis: normal  Intervals: normal  ST/T Wave abnormalities: nonspecific ST/T changes  Conduction Disutrbances:none  Narrative Interpretation:   Old EKG Reviewed: none available   10:59 PM Pt has been admitted to hospitalist service, states pain has been improved with nitroglycerin.  Pt was additionally given morphine and reports increase in pain 2-3 minutes after getting the morphine.  Reporting pain 10/10.  EKG repeated and is unchanged. Repeat exam: RRR, CTAB, abd soft, nontender, no guarding, no rebound. Dr Patria Mane made aware.   Diagnosis: chest pain  MDM  Pt with hx mitral valve prolapse p/w intermittent central chest pain and SOB with exertion and constant chest pain with radiation into left jaw and left scapular with associated SOB that began at 2pm today. EKG nonischemic.  Troponin negative.  Patient admitted to [facility] hospitalist Dr Onalee Hua for chest pain. Discussed all results with patient and plan for admission.  Pt verbalizes understanding and agrees with plan.      [place], PA-C 09/24/12 0028

## 2012-09-24 ENCOUNTER — Encounter (HOSPITAL_COMMUNITY): Payer: Self-pay | Admitting: Physician Assistant

## 2012-09-24 DIAGNOSIS — I059 Rheumatic mitral valve disease, unspecified: Secondary | ICD-10-CM

## 2012-09-24 LAB — CBC
MCV: 87.6 fL (ref 78.0–100.0)
Platelets: 308 10*3/uL (ref 150–400)
RDW: 13.5 % (ref 11.5–15.5)
WBC: 10.5 10*3/uL (ref 4.0–10.5)

## 2012-09-24 LAB — BASIC METABOLIC PANEL
CO2: 27 mEq/L (ref 19–32)
Calcium: 9.4 mg/dL (ref 8.4–10.5)
Creatinine, Ser: 0.79 mg/dL (ref 0.50–1.10)
GFR calc Af Amer: >90 mL/min (ref >90)

## 2012-09-24 LAB — TROPONIN I: Troponin I: <0.3 ng/mL (ref <0.30)

## 2012-09-24 MED ORDER — ASPIRIN EC 81 MG PO TBEC
81.0000 mg | DELAYED_RELEASE_TABLET | Freq: Every day | ORAL | Status: DC
  Administered 2012-09-24 – 2012-09-25 (×2): 81 mg via ORAL
  Filled 2012-09-24 (×2): qty 1

## 2012-09-24 MED ORDER — REGADENOSON 0.4 MG/5ML IV SOLN
0.4000 mg | Freq: Once | INTRAVENOUS | Status: AC
  Administered 2012-09-25: 0.4 mg via INTRAVENOUS
  Filled 2012-09-24: qty 5

## 2012-09-24 MED ORDER — ONDANSETRON HCL 4 MG PO TABS
4.0000 mg | ORAL_TABLET | Freq: Four times a day (QID) | ORAL | Status: DC | PRN
  Administered 2012-09-24: 4 mg via ORAL
  Filled 2012-09-24: qty 1

## 2012-09-24 MED ORDER — SODIUM CHLORIDE 0.9 % IJ SOLN: 3.0000 mL | INTRAMUSCULAR | Status: DC | PRN

## 2012-09-24 MED ORDER — ALUM & MAG HYDROXIDE-SIMETH 200-200-20 MG/5ML PO SUSP: 30.0000 mL | Freq: Four times a day (QID) | ORAL | Status: DC | PRN

## 2012-09-24 MED ORDER — ACETAMINOPHEN 325 MG PO TABS
650.0000 mg | ORAL_TABLET | ORAL | Status: DC | PRN
  Administered 2012-09-24: 650 mg via ORAL

## 2012-09-24 MED ORDER — NITROGLYCERIN 2 % TD OINT
1.0000 [in_us] | TOPICAL_OINTMENT | Freq: Three times a day (TID) | TRANSDERMAL | Status: DC
  Administered 2012-09-24: 1 [in_us] via TOPICAL
  Filled 2012-09-24: qty 30

## 2012-09-24 MED ORDER — ONDANSETRON HCL 4 MG/2ML IJ SOLN: 4.0000 mg | Freq: Four times a day (QID) | INTRAMUSCULAR | Status: DC | PRN

## 2012-09-24 MED ORDER — MORPHINE SULFATE 2 MG/ML IJ SOLN: 2.0000 mg | INTRAMUSCULAR | Status: DC | PRN

## 2012-09-24 MED ORDER — SODIUM CHLORIDE 0.9 % IJ SOLN
3.0000 mL | Freq: Two times a day (BID) | INTRAMUSCULAR | Status: DC
  Administered 2012-09-24 (×3): 3 mL via INTRAVENOUS

## 2012-09-24 MED ORDER — SODIUM CHLORIDE 0.9 % IV SOLN
250.0000 mL | INTRAVENOUS | Status: AC
  Administered 2012-09-24: 250 mL via INTRAVENOUS

## 2012-09-24 MED ORDER — NAPROXEN 375 MG PO TABS
375.0000 mg | ORAL_TABLET | Freq: Two times a day (BID) | ORAL | Status: DC
  Filled 2012-09-24: qty 1

## 2012-09-24 MED ORDER — SODIUM CHLORIDE 0.9 % IV SOLN: 250.0000 mL | INTRAVENOUS | Status: DC | PRN

## 2012-09-24 MED ORDER — ENOXAPARIN SODIUM 80 MG/0.8ML ~~LOC~~ SOLN: 1.0000 mg/kg | Freq: Two times a day (BID) | SUBCUTANEOUS | Status: DC

## 2012-09-24 MED ORDER — SODIUM CHLORIDE 0.9 % IJ SOLN
3.0000 mL | Freq: Two times a day (BID) | INTRAMUSCULAR | Status: DC
  Administered 2012-09-25: 3 mL via INTRAVENOUS

## 2012-09-24 MED ORDER — GI COCKTAIL ~~LOC~~
30.0000 mL | Freq: Once | ORAL | Status: AC
  Administered 2012-09-24: 30 mL via ORAL
  Filled 2012-09-24: qty 30

## 2012-09-24 MED ORDER — NAPROXEN 375 MG PO TABS
375.0000 mg | ORAL_TABLET | Freq: Two times a day (BID) | ORAL | Status: DC
  Administered 2012-09-24 – 2012-09-25 (×2): 375 mg via ORAL
  Filled 2012-09-24 (×4): qty 1

## 2012-09-24 NOTE — Progress Notes (Signed)
Post 250cc bolus B/P = 124/72, P = 63

## 2012-09-24 NOTE — Progress Notes (Signed)
Utilization Review Completed Drago Hammonds J. Cordarrel Stiefel, RN, BSN, NCM [phone]  

## 2012-09-24 NOTE — Progress Notes (Signed)
  Echocardiogram 2D Echocardiogram has been performed.  Calistro Rauf FRANCES 09/24/2012, 1:14 PM

## 2012-09-24 NOTE — Progress Notes (Signed)
[facility]                                                                                Patient Demographics  Teresa Mcdaniel, is a 57 y.o. female, DOB - 01-19-1956, WUJ:[id number], NFA:[id number]  Admit date - 09/23/2012  Admitting Physician Tarry Kos, MD  Outpatient Primary MD for the patient is No PCP Per Patient  LOS - 1   Chief Complaint  Patient presents with  . Jaw Pain        Assessment & Plan    1. Substernal chest pain in a lady with a history of pericarditis and mitral valve prolapse.- First set of troponin was negative, EKG is nonacute with nonspecific changes, echogram is pending, I have requested cardiology to see the patient in the light of her history of pericarditis, we'll defer any further workup and chest pain management to cardiology.    2. Transient low blood pressure this morning. Resolved after we remove nitroglycerin and give her gentle IV fluid bolus. We will monitor.     Code Status: full  Family Communication: none present  Disposition Plan: home   Procedures Echo   Consults  Cards   DVT Prophylaxis  SCDs    Lab Results  Component Value Date   PLT 308 09/24/2012    Medications  Scheduled Meds: . aspirin EC  81 mg Oral Daily  . sodium chloride  3 mL Intravenous Q12H  . sodium chloride  3 mL Intravenous Q12H   Continuous Infusions:  PRN Meds:.alum & mag hydroxide-simeth, morphine injection, nitroGLYCERIN, ondansetron (ZOFRAN) IV, ondansetron, sodium chloride  Antibiotics     Anti-infectives   None       Time Spent in minutes   35   Susa Raring K M.D on 09/24/2012 at 11:22 AM  Between 7am to 7pm - Pager - [phone]  After 7pm go to [email or web address] - password [email or web address]  And look for the night coverage person covering for me after hours  [facility] Office  [phone]    Subjective:   Teresa Mcdaniel today has, No headache, + substernal 1-2/10 chest pain, No abdominal pain - No Nausea, No new  weakness tingling or numbness, No Cough - SOB.   Objective:   Filed Vitals:   09/24/12 0005 09/24/12 0457 09/24/12 0658 09/24/12 1114  BP: 103/56 86/50 90/45  124/72  Pulse: 63 72 48 63  Temp: 97.8 F (36.6 C) 97.8 F (36.6 C)    TempSrc: Oral Oral    Resp: 18 18 18    Height: 5\' 4"  (1.626 m)     Weight: 69.945 kg (154 lb 3.2 oz)     SpO2: 96% 98%  98%    Wt Readings from Last 3 Encounters:  09/24/12 69.945 kg (154 lb 3.2 oz)     Intake/Output Summary (Last 24 hours) at 09/24/12 1122 Last data filed at 09/24/12 1115  Gross per 24 hour  Intake    610 ml  Output    950 ml  Net   -340 ml    Exam Awake Alert, Oriented X 3, No new F.N deficits, Normal affect [place].AT,PERRAL Supple Neck,No JVD, No cervical lymphadenopathy appriciated.  Symmetrical Chest wall movement,  Good air movement bilaterally, CTAB RRR,No Gallops,Rubs or new Murmurs, No Parasternal Heave +ve B.Sounds, Abd Soft, Non tender, No organomegaly appriciated, No rebound - guarding or rigidity. No Cyanosis, Clubbing or edema, No new Rash or bruise      Data Review   Micro Results No results found for this or any previous visit (from the past 240 hour(s)).  Radiology Reports Dg Chest 2 View  09/23/2012   *RADIOLOGY REPORT*  Clinical Data: Chest pain, jaw pain  CHEST - 2 VIEW  Comparison: 09/12/2007  Findings: Cardiomediastinal silhouette is stable.  No acute infiltrate or pleural effusion.  No pulmonary edema.  Mild thoracic dextroscoliosis.  IMPRESSION: No active disease.  No significant change.   Original Report Authenticated By: Natasha Mead, M.D.    CBC  Recent Labs Lab 09/23/12 1618 09/23/12 1856 09/24/12 0204  WBC  --  12.7* 10.5  HGB 13.9 13.0 12.1  HCT 41.0 38.0 36.1  PLT  --  334 308  MCV  --  87.0 87.6  MCH  --  29.7 29.4  MCHC  --  34.2 33.5  RDW  --  13.4 13.5  LYMPHSABS  --  5.1*  --   MONOABS  --  0.6  --   EOSABS  --  0.3  --   BASOSABS  --  0.1  --     Chemistries   Recent  Labs Lab 09/23/12 1618 09/24/12 0204  NA 143 143  K 3.8 3.7  CL 105 105  CO2  --  27  GLUCOSE 94 125*  BUN 9 11  CREATININE 0.80 0.79  CALCIUM  --  9.4   ------------------------------------------------------------------------------------------------------------------ estimated creatinine clearance is 72.7 ml/min (by C-G formula based on Cr of 0.79). ------------------------------------------------------------------------------------------------------------------ No results found for this basename: HGBA1C,  in the last 72 hours ------------------------------------------------------------------------------------------------------------------ No results found for this basename: CHOL, HDL, LDLCALC, TRIG, CHOLHDL, LDLDIRECT,  in the last 72 hours ------------------------------------------------------------------------------------------------------------------ No results found for this basename: TSH, T4TOTAL, FREET3, T3FREE, THYROIDAB,  in the last 72 hours ------------------------------------------------------------------------------------------------------------------ No results found for this basename: VITAMINB12, FOLATE, FERRITIN, TIBC, IRON, RETICCTPCT,  in the last 72 hours  Coagulation profile No results found for this basename: INR, PROTIME,  in the last 168 hours  No results found for this basename: DDIMER,  in the last 72 hours  Cardiac Enzymes  Recent Labs Lab 09/24/12 0204  TROPONINI <0.30   ------------------------------------------------------------------------------------------------------------------ No components found with this basename: POCBNP,

## 2012-09-24 NOTE — Progress Notes (Signed)
Patient arrived to unit from ED via stretcher. Patient alert, oriented and able to ambulate with minimal assistance.  Admission weight and vitals obtained. Fall and safety plan reviewed with patient.  Patient currently resting comfortably, pain free, with call light in reach and bed alarm in use. Will continue to monitor. Blood pressure 103/56, pulse 63, temperature 97.8 F (36.6 C), temperature source Oral, resp. rate 18, height 5\' 4"  (1.626 m), weight 69.945 kg (154 lb 3.2 oz), SpO2 96.00%. Troy Sine

## 2012-09-24 NOTE — Progress Notes (Signed)
B/p  was 86/50 @ 0658 and 90/45 @ [id number].  Dr. Thedore Mins notified via text page and nitro paste removed as ordered @ hand-off report from night shift nurse.  250cc bolus NS up this am and infusing @ present.

## 2012-09-24 NOTE — Progress Notes (Signed)
GI Cocktail given per order for chest pain  # 2 this morning.

## 2012-09-24 NOTE — Consult Note (Signed)
CARDIOLOGY CONSULT NOTE   Patient ID: Teresa Mcdaniel MRN: [id number] DOB/AGE: 59-Sep-1957 60 y.o.  Admit date: 09/23/2012  Primary Physician   was at [facility], has not had a checkup in greater than 5 years Primary Cardiologist  was Dr. Clarene Duke, discharge for financial reasons. New to [facility] Reason for Consultation   Chest pain  WUJ:[id number] Teresa Mcdaniel is a 57 y.o. female with no history of CAD.  She was admitted with chest pain and cardiology was asked to evaluate her. Last assessment was about 4 years ago at [place], with an echo and possible nuclear stress test, records not currently available.  Teresa Mcdaniel recently started a job that she states is very stressful for her. She has been having intermittent chest pain for at least 2 weeks. The symptoms was started every day at work and resolved in the evening when she rested. 3 days ago, she had onset of substernal chest pain (her usual), it started at work and reached an 8/10. She went home and rested and the pain decreased to a 4/10. She was able to get some sleep. She woke with pain ongoing but was able to work that day. The pain would worsen at work, but with ease down at night. The pain would worsen with minimal activity, and stress, but did not change with deep inspiration, cough and was not associated with meals. Her chest wall is nontender.  Yesterday, she had onset of left-sided pain in her neck that radiated up into the left side of her head. She thinks this may have come from the chest pain. The chest pain also seemed to be getting worse and reached 9/10. She then developed chest pain in the area of her left scapula. The pain seemed to be going through to her back from her chest. She had some associated shortness of breath and nausea but no vomiting or diaphoresis. She felt like she was having a heart attack and came to the emergency room.   In the emergency room, she received ASA 324 mg, Tylenol 650 mg, a GI cocktail, nitroglycerin sublingual x2, 1 inch  of nitroglycerin paste and 4 mg of Zofran. The pain decreased to a 2/10. She is not sure which of these medications did the most good. She then got 2 mg of morphine in an effort to completely relieve the pain but she became very nauseated and the pain went up to a 10/10. She was then given Toradol 30 mg IV and Ativan 1 mg IV. The pain came back down and she was able to sleep. She feels the last 2 medications did the most good. Today, she is still having some discomfort at a 2/10 he was given a GI cocktail. After the GI cocktail, her pain is now a 4-6/10.   Past Medical History  Diagnosis Date  . Mitral valve prolapse     Dx at time of pericarditis  . Interstitial cystitis   . Gastritis before 2000  . Pericarditis 1990 or before     Past Surgical History  Procedure Laterality Date  . Abdominal hysterectomy    . Cardiac catheterization  Before 1990    Clean per patient    Allergies  Allergen Reactions  . Codeine Nausea Only  . Levaquin [Levofloxacin In D5w]   . Lodine [Etodolac]   . Nsaids Other (See Comments)    Stomach problems  . Tetracyclines & Related Swelling  . Sulfa Antibiotics Swelling    I have reviewed the patient's current medications . aspirin  EC  81 mg Oral Daily  . sodium chloride  3 mL Intravenous Q12H  . sodium chloride  3 mL Intravenous Q12H     alum & mag hydroxide-simeth, morphine injection, nitroGLYCERIN, ondansetron (ZOFRAN) IV, ondansetron, sodium chloride  Prior to Admission medications   Medication Sig Start Date End Date Taking? Authorizing Provider  Ascorbic Acid (VITAMIN C PO) Take 1 tablet by mouth daily.   Yes Historical Provider, MD  b complex vitamins tablet Take 1 tablet by mouth daily.   Yes Historical Provider, MD  Cholecalciferol (VITAMIN D-3 PO) Take 1 tablet by mouth daily.   Yes Historical Provider, MD  GARLIC PO Take 1 tablet by mouth daily.   Yes Historical Provider, MD  IRON PO Take 1 tablet by mouth daily.   Yes Historical Provider,  MD  loratadine (CLARITIN) 10 MG tablet Take 10 mg by mouth daily as needed for allergies.   Yes Historical Provider, MD  naproxen sodium (ANAPROX) 220 MG tablet Take 220 mg by mouth daily as needed (for pain).   Yes Historical Provider, MD  ranitidine (ZANTAC) 150 MG tablet Take 300 mg by mouth daily.   Yes Historical Provider, MD     History   Social History  . Marital Status: Married    Spouse Name: N/A    Number of Children: N/A  . Years of Education: N/A   Occupational History  . Works for [employer]    Social History Main Topics  . Smoking status: Never Smoker   . Smokeless tobacco: Not on file  . Alcohol Use: No  . Drug Use: No  . Sexual Activity: Not on file   Other Topics Concern  . 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    Family Status  Relation Status Death Age  . Mother Alive     Hx CAD, atrial fib  . Father Deceased 40    MI in his 75s, died with ischemia  . Sister Alive     Hx MI in her 36s  . Sister Alive     Hx cath, no stent   . Brother Alive     No known hx CAD  . Brother Alive     No known hx CAD    ROS: She has had abdominal pain and problems with constipation since the dog bites in March 2014. She has had musculoskeletal chest pain after strenuous activity such as sharp work but her chest wall would be tender with these symptoms and she feels that it is clearly musculoskeletal. No other recent illnesses, fevers or chills. Full 14 point review of systems complete and found to be negative unless listed above.  Physical Exam: Blood pressure 90/45, pulse 48, temperature 97.8 F (36.6 C), temperature source Oral, resp. rate 18, height 5\' 4"  (1.626 m), weight 154 lb 3.2 oz (69.945 kg), SpO2 98.00%.  General: Well developed, well nourished, female in no acute distress Head: Eyes PERRLA, No xanthomas.   Normocephalic and atraumatic, oropharynx without edema or exudate. Dentition: Good Lungs:  Clear to auscultation bilaterally Heart: HRRR S1 S2, no rub/gallop, 2/6 murmur. pulses are 2+ all 4 extrem.   Neck: No carotid bruits. No lymphadenopathy.  JVD not elevated. Abdomen: Bowel sounds present, abdomen soft and tender on the left side without masses or hernias noted. Msk:  No spine or cva tenderness. No weakness, no joint deformities or effusions. Extremities: No clubbing or cyanosis.  No edema.  Neuro: Alert and oriented X 3. No focal deficits noted. Psych:  Good affect, responds appropriately Skin: No rashes or lesions noted.  Labs:  Lab Results  Component Value Date   WBC 10.5 09/24/2012   HGB 12.1 09/24/2012   HCT 36.1 09/24/2012   MCV 87.6 09/24/2012   PLT 308 09/24/2012     Recent Labs Lab 09/24/12 0204  NA 143  K 3.7  CL 105  CO2 27  BUN 11  CREATININE 0.79  CALCIUM 9.4  GLUCOSE 125*    Recent Labs  09/24/12 0204  TROPONINI <0.30    Recent Labs  09/23/12 1616 09/23/12 2225  TROPIPOC 0.01 0.00   ECG:  23-Sep-2012 15:51:26  Normal sinus rhythm Nonspecific ST abnormality - diffuse, minor ST changes infero-lateral leads Vent. rate 67 BPM PR interval 154 ms QRS duration 94 ms QT/QTc 420/443 ms P-R-T axes 63 54 51  Radiology:  Dg Chest 2 View 09/23/2012   *RADIOLOGY REPORT*  Clinical Data: Chest pain, jaw pain  CHEST - 2 VIEW  Comparison: 09/12/2007  Findings: Cardiomediastinal silhouette is stable.  No acute infiltrate or pleural effusion.  No pulmonary edema.  Mild thoracic dextroscoliosis.  IMPRESSION: No active disease.  No significant change.   Original Report Authenticated By: Natasha Mead, M.D.    ASSESSMENT AND PLAN:   The patient was seen today by Dr Myrtis Ser, the patient evaluated and the data reviewed.  Principal Problem:   Chest pain radiating to jaw - she has multiple cardiac risk factors but has had continuous pain for greater than 48 hours without enzyme elevations or clear ECG changes. She will need an echocardiogram and a stress test. We will  order the stress test for tomorrow. We'll check a lipid profile.  Active Problems:   Mitral valve prolapse - eval with echocardiogram    h/o Pericarditis - no rub on exam and symptoms are not typical of pericarditis, review echo.   SignedTheodore Demark, PA-C 09/24/2012 11:14 AM Beeper [phone] Patient seen and examined. I agree with the assessment and plan as detailed above. See also my additional thoughts below.   I have reviewed the information completely with Teresa Mcdaniel. I have reviewed the note above and agree. The patient has had prolonged chest pain with no evidence of EKG change and no evidence of troponin elevation. She is received a great deal of pain medication. She continues to have some discomfort. I feel that we need to proceed with complete in-hospital noninvasive evaluation. Echo has been ordered but not yet done. She needs to have a nuclear stress study and we are writing the orders for this.  Willa Rough, MD, [facility] 09/24/2012 11:42 AM

## 2012-09-25 ENCOUNTER — Observation Stay (HOSPITAL_COMMUNITY): Payer: MEDICAID

## 2012-09-25 ENCOUNTER — Other Ambulatory Visit (HOSPITAL_COMMUNITY): Payer: Self-pay

## 2012-09-25 DIAGNOSIS — R079 Chest pain, unspecified: Secondary | ICD-10-CM

## 2012-09-25 MED ORDER — TECHNETIUM TC 99M SESTAMIBI - CARDIOLITE
30.0000 | Freq: Once | INTRAVENOUS | Status: AC | PRN
  Administered 2012-09-25: 30 via INTRAVENOUS

## 2012-09-25 MED ORDER — REGADENOSON 0.4 MG/5ML IV SOLN
INTRAVENOUS | Status: AC
  Filled 2012-09-25: qty 5

## 2012-09-25 MED ORDER — TECHNETIUM TC 99M SESTAMIBI - CARDIOLITE
10.0000 | Freq: Once | INTRAVENOUS | Status: AC | PRN
  Administered 2012-09-25: 08:00:00 10 via INTRAVENOUS

## 2012-09-25 MED ORDER — SODIUM CHLORIDE 0.9 % IJ SOLN
80.0000 mg | INTRAVENOUS | Status: AC
  Administered 2012-09-25: 80 mg via INTRAVENOUS
  Filled 2012-09-25: qty 3.2

## 2012-09-25 NOTE — Progress Notes (Signed)
Patient rates pain 3/10 and states she feels better.  NO complaints of any other symptoms.  Patient will be transported back to inpatient room.

## 2012-09-25 NOTE — Progress Notes (Signed)
Pt d/c home. D/c instructions and medications reviewed with Pt. Pt states understanding. All Pt questions answered. Pt giving inform to find a PCP.

## 2012-09-25 NOTE — Progress Notes (Signed)
Patient ID: Teresa Mcdaniel, female   DOB: 28-Jun-1955, 57 y.o.   MRN: [id number]    Subjective:  Patient getting nuclear study  Objective:  Filed Vitals:   09/24/12 1404 09/24/12 2100 09/24/12 2120 09/25/12 0529  BP: 99/57 92/52 98/53  106/55  Pulse: 64 77 71 66  Temp: 97.2 F (36.2 C) 98.9 F (37.2 C)  97.6 F (36.4 C)  TempSrc: Oral Oral  Oral  Resp: 17 18  18   Height:      Weight:    154 lb 8 oz (70.081 kg)  SpO2: 98% 97%  94%    Intake/Output from previous day:  Intake/Output Summary (Last 24 hours) at 09/25/12 1021 Last data filed at 09/25/12 4098  Gross per 24 hour  Intake   1105 ml  Output   3075 ml  Net  -1970 ml    Physical Exam: Affect appropriate Healthy:  appears stated age HEENT: normal Neck supple with no adenopathy JVP normal no bruits no thyromegaly Lungs clear with no wheezing and good diaphragmatic motion Heart:  S1/S2 no murmur, no rub, gallop or click PMI normal Abdomen: benighn, BS positve, no tenderness, no AAA no bruit.  No HSM or HJR Distal pulses intact with no bruits No edema Neuro non-focal Skin warm and dry No muscular weakness   Lab Results: Basic Metabolic Panel:  Recent Labs  11/91/47 1618 09/24/12 0204  NA 143 143  K 3.8 3.7  CL 105 105  CO2  --  27  GLUCOSE 94 125*  BUN 9 11  CREATININE 0.80 0.79  CALCIUM  --  9.4   CBC:  Recent Labs  09/23/12 1856 09/24/12 0204  WBC 12.7* 10.5  NEUTROABS 6.7  --   HGB 13.0 12.1  HCT 38.0 36.1  MCV 87.0 87.6  PLT 334 308   Cardiac Enzymes:  Recent Labs  09/24/12 0204  TROPONINI <0.30    Imaging: Dg Chest 2 View  09/23/2012   *RADIOLOGY REPORT*  Clinical Data: Chest pain, jaw pain  CHEST - 2 VIEW  Comparison: 09/12/2007  Findings: Cardiomediastinal silhouette is stable.  No acute infiltrate or pleural effusion.  No pulmonary edema.  Mild thoracic dextroscoliosis.  IMPRESSION: No active disease.  No significant change.   Original Report Authenticated By: Natasha Mead, M.D.      Cardiac Studies:  ECG:  SR rate 63 normal   Telemetry: NSR no arrhythmia 09/25/2012   Echo: Normal EF 60-65% reviewed   Medications:   . aspirin EC  81 mg Oral Daily  . naproxen  375 mg Oral BID WC  . regadenoson      . sodium chloride  3 mL Intravenous Q12H  . sodium chloride  3 mL Intravenous Q12H       Assessment/Plan:  Chest Pain: Prolonged but no ecg changes and negative enzymes  Echo normal with no RWMA;s  Ok to discharge home If nuclear study is normal  Dr Myrtis Ser should be available to read   Charlton Haws 09/25/2012, 10:21 AM

## 2012-09-25 NOTE — Discharge Summary (Signed)
[facility]                                                                                   Teresa Mcdaniel, is a 57 y.o. female  DOB 1955/08/03  MRN [id number].  Admission date:  09/23/2012  Discharge Date:  09/25/2012  Primary MD  No PCP Per Patient  Admitting Physician  Tarry Kos, MD  Admission Diagnosis  Mitral valve prolapse [424.0] Pericarditis [423.9] Chest pain radiating to jaw [786.50] Chest pain [786.50]  Discharge Diagnosis     Principal Problem:   Chest pain radiating to jaw Active Problems:   Mitral valve prolapse   h/o Pericarditis    Past Medical History  Diagnosis Date  . Mitral valve prolapse     Dx at time of pericarditis  . Interstitial cystitis   . Gastritis before 2000  . Pericarditis 1990 or before    Past Surgical History  Procedure Laterality Date  . Abdominal hysterectomy    . Cardiac catheterization  Before 1990    Clean per patient     Recommendations for primary care physician for things to follow:       Discharge Diagnoses:   Principal Problem:   Chest pain radiating to jaw Active Problems:   Mitral valve prolapse   h/o Pericarditis    Discharge Condition: Stable   Diet recommendation: See Discharge Instructions below   Consults Cards    History of present illness and  Hospital Course:     Kindly see H&P for history of present illness and admission details, please review complete Labs, Consult reports and Test reports for all details in brief Teresa Mcdaniel, is a 57 y.o. female, patient with history of Pericarditis, mitral valve prolapse was admitted to the hospital for atypical chest pain, EKG was non acute and she ruled out for MI with serial negative cardiac enzymes, she underwent echogram which was unremarkable and was seen by cardiology and cleared for home discharge.  She underwent Lexi scan which did not show any reversible ischemia, she will be now discharged home on below mentioned medications, her chest  pain could have been due to reflux and will request one time outpatient GI followup for an outpatient EGD. She is on Zantac which will be continued for now.         Today   Subjective:   Teresa Mcdaniel today has no headache,no chest abdominal pain,no new weakness tingling or numbness, feels much better wants to go home today.    Objective:   Blood pressure 124/56, pulse 88, temperature 97.6 F (36.4 C), temperature source Oral, resp. rate 18, height 5\' 4"  (1.626 m), weight 70.081 kg (154 lb 8 oz), SpO2 94.00%.   Intake/Output Summary (Last 24 hours) at 09/25/12 1338 Last data filed at 09/25/12 1308  Gross per 24 hour  Intake    983 ml  Output   2725 ml  Net  -1742 ml    Exam Awake Alert, Oriented *3, No new F.N deficits, Normal affect [place].AT,PERRAL Supple Neck,No JVD, No cervical lymphadenopathy appriciated.  Symmetrical Chest wall movement, Good air movement bilaterally, CTAB RRR,No Gallops,Rubs or new Murmurs, No Parasternal Heave +ve B.Sounds, Abd  Soft, Non tender, No organomegaly appriciated, No rebound -guarding or rigidity. No Cyanosis, Clubbing or edema, No new Rash or bruise  Data Review   Major procedures and Radiology Reports - PLEASE review detailed and final reports for all details, in brief -   Echo  - Left ventricle: The cavity size was normal. Wall thickness was normal. Systolic function was normal. The estimated ejection fraction was in the range of 60% to 65%. - Mitral valve: Mild regurgitation.    Lexiscan    No evidence for pharmacologically induced ischemia.  Ejection fraction is 72% with normal wall motion.   Dg Chest 2 View  09/23/2012   *RADIOLOGY REPORT*  Clinical Data: Chest pain, jaw pain  CHEST - 2 VIEW  Comparison: 09/12/2007  Findings: Cardiomediastinal silhouette is stable.  No acute infiltrate or pleural effusion.  No pulmonary edema.  Mild thoracic dextroscoliosis.  IMPRESSION: No active disease.  No significant change.   Original  Report Authenticated By: Natasha Mead, M.D.   Nm Myocar Multi W/spect W/wall Motion / Ef  09/25/2012   *RADIOLOGY REPORT*  Clinical Data:  Chest pain.  Technique:  Standard myocardial SPECT imaging performed after resting intravenous injection of 10 mCi Tc-37m sestimibi. Subsequently, intravenous infusion of regadenoson performed under the supervision of the Cardiology staff.  At peak effect of the drug, 30 mCi Tc-36m sestimibi injected intravenously and standard myocardial SPECT imaging performed.  Quantitative gated imaging also performed to evaluate left ventricular wall motion and estimate left ventricular ejection fraction.  Comparison:  None  MYOCARDIAL IMAGING WITH SPECT (REST AND PHARMACOLOGIC-STRESS)  Findings:  The left ventricle myocardial perfusion is within normal limits.  No suspicious reversible defect.  No significant fixed defect.  GATED LEFT VENTRICULAR WALL MOTION STUDY  Findings:  Review of the gated images demonstrates normal wall motion.  LEFT VENTRICULAR EJECTION FRACTION  Findings:  QGS ejection fraction measures 72% , with an end- diastolic volume of 54 ml and an end-systolic volume of 15 ml.  IMPRESSION: No evidence for pharmacologically induced ischemia.  Ejection fraction is 72% with normal wall motion.   Original Report Authenticated By: Richarda Overlie, M.D.    Micro Results      No results found for this or any previous visit (from the past 240 hour(s)).   CBC w Diff: Lab Results  Component Value Date   WBC 10.5 09/24/2012   HGB 12.1 09/24/2012   HCT 36.1 09/24/2012   PLT 308 09/24/2012   LYMPHOPCT 40 09/23/2012   MONOPCT 5 09/23/2012   EOSPCT 2 09/23/2012   BASOPCT 0 09/23/2012    CMP: Lab Results  Component Value Date   NA 143 09/24/2012   K 3.7 09/24/2012   CL 105 09/24/2012   CO2 27 09/24/2012   BUN 11 09/24/2012   CREATININE 0.79 09/24/2012   PROT 7.3 05/01/2012   ALBUMIN 4.0 05/01/2012   BILITOT 0.2* 05/01/2012   ALKPHOS 82 05/01/2012   AST 23 05/01/2012   ALT 21  05/01/2012  .   Discharge Instructions     Follow with Primary MD  in 7 days   Get CBC, CMP, checked 7 days by Primary MD and again as instructed by your Primary MD. Get a 2 view Chest X ray done next visit.  Get Medicines reviewed and adjusted.  Please request your Prim.MD to go over all Hospital Tests and Procedure/Radiological results at the follow up, please get all Hospital records sent to your Prim MD by signing hospital release before you  go home.  Activity: As tolerated with Full fall precautions use walker/cane & assistance as needed   Diet:  Heart Healthy  For Heart failure patients - Check your Weight same time everyday, if you gain over 2 pounds, or you develop in leg swelling, experience more shortness of breath or chest pain, call your Primary MD immediately. Follow Cardiac Low Salt Diet and 1.8 lit/day fluid restriction.  Disposition Home    If you experience worsening of your admission symptoms, develop shortness of breath, life threatening emergency, suicidal or homicidal thoughts you must seek medical attention immediately by calling 911 or calling your MD immediately  if symptoms less severe.  You Must read complete instructions/literature along with all the possible adverse reactions/side effects for all the Medicines you take and that have been prescribed to you. Take any new Medicines after you have completely understood and accpet all the possible adverse reactions/side effects.   Do not drive and provide baby sitting services if your were admitted for syncope or siezures until you have seen by Primary MD or a Neurologist and advised to do so again.  Do not drive when taking Pain medications.    Do not take more than prescribed Pain, Sleep and Anxiety Medications  Special Instructions: If you have smoked or chewed Tobacco  in the last 2 yrs please stop smoking, stop any regular Alcohol  and or any Recreational drug use.  Wear Seat belts while  driving.   Please note  You were cared for by a hospitalist during your hospital stay. If you have any questions about your discharge medications or the care you received while you were in the hospital after you are discharged, you can call the unit and asked to speak with the hospitalist on call if the hospitalist that took care of you is not available. Once you are discharged, your primary care physician will handle any further medical issues. Please note that NO REFILLS for any discharge medications will be authorized once you are discharged, as it is imperative that you return to your primary care physician (or establish a relationship with a primary care physician if you do not have one) for your aftercare needs so that they can reassess your need for medications and monitor your lab values.  Follow-up Information   Follow up with PCP. Schedule an appointment as soon as possible for a visit in 1 week.      Follow up with Willa Rough, MD. Schedule an appointment as soon as possible for a visit in 1 week.   Specialty:  Cardiology   Contact information:   [address] [place] [address] [phone]       Follow up with Stan Head, MD. Schedule an appointment as soon as possible for a visit in 1 week. (reflux)    Specialty:  Gastroenterology   Contact information:   [address] [place] [address] [phone]         Discharge Medications     Medication List         b complex vitamins tablet  Take 1 tablet by mouth daily.     GARLIC PO  Take 1 tablet by mouth daily.     IRON PO  Take 1 tablet by mouth daily.     loratadine 10 MG tablet  Commonly known as:  CLARITIN  Take 10 mg by mouth daily as needed for allergies.     naproxen sodium 220 MG tablet  Commonly  known as:  ANAPROX  Take 220 mg by mouth daily as needed (for pain).     ranitidine 150 MG tablet  Commonly known as:  ZANTAC  Take 300 mg by mouth daily.     VITAMIN C PO   Take 1 tablet by mouth daily.     VITAMIN D-3 PO  Take 1 tablet by mouth daily.           Total Time in preparing paper work, data evaluation and todays exam - 35 minutes  Leroy Sea M.D on 09/25/2012 at 1:38 PM  [facility] Office  [phone]

## 2012-09-25 NOTE — Progress Notes (Signed)
Pt O4x, NPO at this time. Pt asking questions about stress test, RN answered questions. Pt dines CP or SOB, pt wonders if high stress job is r/t the CP that brought her in [place]. Pt last BM was on 8-12, states normal goes 3-5 days w/o BM. Pt periodically takes activated charcoal for stomach  at home. Pt may benefit from stool softener

## 2012-09-25 NOTE — Progress Notes (Signed)
Patient continues to complain of chest pain 8/10.  Hurman Horn PA notified.

## 2012-09-25 NOTE — Progress Notes (Signed)
Patient is having second set of Nuc Med pictures performed.  Will continue to monitor her pain.

## 2012-09-26 NOTE — ED Provider Notes (Signed)
Medical screening examination/treatment/procedure(s) were conducted as a shared visit with non-physician practitioner(s) and myself.  I personally evaluated the patient during the encounter  ecg without ischemic changes including repeat ecg. Admit for ongoing CP rule out and evaluation  Lyanne Co, MD 09/26/12 1433

## 2012-09-26 NOTE — Progress Notes (Signed)
   CARE MANAGEMENT NOTE 09/26/2012  Patient:  Teresa Mcdaniel, Teresa Mcdaniel   Account Number:  [id number]  Date Initiated:  09/26/2012  Documentation initiated by:  [facility]  Subjective/Objective Assessment:     Action/Plan:   Anticipated DC Date:  09/25/2012   Anticipated DC Plan:  HOME W HOME HEALTH SERVICES      DC Planning Services  CM consult  PCP issues      Choice offered to / List presented to:             Status of service:  Completed, signed off Medicare Important Message given?   (If response is "NO", the following Medicare IM given date fields will be blank) Date Medicare IM given:   Date Additional Medicare IM given:    Discharge Disposition:  HOME/SELF CARE  Per UR Regulation:    If discussed at Long Length of Stay Meetings, dates discussed:    Comments:  09/26/2012 1100 Provided pt with contact info on dc instruction on clinics that accept self pay. Explained to pt that she will need to call and arrange appt. Isidoro Donning RN CCM Case Mgmt phone [phone]

## 2012-12-16 ENCOUNTER — Other Ambulatory Visit: Payer: Self-pay

## 2013-03-14 ENCOUNTER — Telehealth: Payer: Self-pay

## 2013-03-21 ENCOUNTER — Ambulatory Visit: Payer: 59 | Attending: Internal Medicine

## 2013-06-01 ENCOUNTER — Ambulatory Visit: Payer: No Typology Code available for payment source | Attending: Internal Medicine | Admitting: Internal Medicine

## 2013-06-01 VITALS — BP 121/74 | HR 61 | Temp 98.0°F | Resp 16 | Ht 65.0 in | Wt 154.0 lb

## 2013-06-01 DIAGNOSIS — J392 Other diseases of pharynx: Secondary | ICD-10-CM

## 2013-06-01 DIAGNOSIS — J040 Acute laryngitis: Secondary | ICD-10-CM | POA: Insufficient documentation

## 2013-06-01 DIAGNOSIS — Z09 Encounter for follow-up examination after completed treatment for conditions other than malignant neoplasm: Secondary | ICD-10-CM | POA: Insufficient documentation

## 2013-06-01 LAB — CBC WITH DIFFERENTIAL/PLATELET
Basophils Absolute: 0 10*3/uL (ref 0.0–0.1)
Basophils Relative: 0 % (ref 0–1)
EOS ABS: 0.2 10*3/uL (ref 0.0–0.7)
EOS PCT: 2 % (ref 0–5)
HEMATOCRIT: 38.3 % (ref 36.0–46.0)
HEMOGLOBIN: 13.1 g/dL (ref 12.0–15.0)
LYMPHS ABS: 4.3 10*3/uL — AB (ref 0.7–4.0)
Lymphocytes Relative: 44 % (ref 12–46)
MCH: 29.5 pg (ref 26.0–34.0)
MCHC: 34.2 g/dL (ref 30.0–36.0)
MCV: 86.3 fL (ref 78.0–100.0)
MONO ABS: 0.5 10*3/uL (ref 0.1–1.0)
MONOS PCT: 5 % (ref 3–12)
Neutro Abs: 4.8 10*3/uL (ref 1.7–7.7)
Neutrophils Relative %: 49 % (ref 43–77)
Platelets: 387 10*3/uL (ref 150–400)
RBC: 4.44 MIL/uL (ref 3.87–5.11)
RDW: 13.7 % (ref 11.5–15.5)
WBC: 9.8 10*3/uL (ref 4.0–10.5)

## 2013-06-01 LAB — COMPREHENSIVE METABOLIC PANEL
ALBUMIN: 4.4 g/dL (ref 3.5–5.2)
ALT: 18 U/L (ref 0–35)
AST: 18 U/L (ref 0–37)
Alkaline Phosphatase: 77 U/L (ref 39–117)
BILIRUBIN TOTAL: 0.4 mg/dL (ref 0.2–1.2)
BUN: 13 mg/dL (ref 6–23)
CO2: 28 meq/L (ref 19–32)
Calcium: 9.8 mg/dL (ref 8.4–10.5)
Chloride: 102 mEq/L (ref 96–112)
Creat: 0.77 mg/dL (ref 0.50–1.10)
GLUCOSE: 86 mg/dL (ref 70–99)
POTASSIUM: 4.1 meq/L (ref 3.5–5.3)
SODIUM: 139 meq/L (ref 135–145)
TOTAL PROTEIN: 7 g/dL (ref 6.0–8.3)

## 2013-06-01 LAB — LIPID PANEL
CHOLESTEROL: 326 mg/dL — AB (ref 0–200)
HDL: 48 mg/dL (ref >39)
LDL Cholesterol: 244 mg/dL — ABNORMAL HIGH (ref 0–99)
TRIGLYCERIDES: 170 mg/dL — AB (ref <150)
Total CHOL/HDL Ratio: 6.8 Ratio
VLDL: 34 mg/dL (ref 0–40)

## 2013-06-01 NOTE — Progress Notes (Signed)
Pt is here to establish care. Pt reports that for a week she has been having pain in her throat. Pt states that her nose get very soar when she touches it. Pt has occasional UTI symptoms

## 2013-06-01 NOTE — Progress Notes (Signed)
Patient ID: Teresa BerlinGloria Holland, female   DOB: 06/17/1955, 58 y.o.   MRN: [id number]   CC: follow up  HPI: Pt is 58 yo female who presents to clinic for follow up. Denies any specific concerns today, has been struglling with what she believes its laryngitis. 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 She denies fevers, chills, no specific abd or urinary concerns.   Allergies  Allergen Reactions  . Codeine Nausea Only  . Levaquin [Levofloxacin In D5w]   . Lodine [Etodolac]   . Nsaids Other (See Comments)    Stomach problems  . Tetracyclines & Related Swelling  . Sulfa Antibiotics Swelling   Past Medical History  Diagnosis Date  . Mitral valve prolapse     Dx at time of pericarditis  . Interstitial cystitis   . Gastritis before 2000  . Pericarditis 1990 or before   Current Outpatient Prescriptions on File Prior to Visit  Medication Sig Dispense Refill  . loratadine (CLARITIN) 10 MG tablet Take 10 mg by mouth daily as needed for allergies.      . naproxen sodium (ANAPROX) 220 MG tablet Take 220 mg by mouth daily as needed (for pain).      . Ascorbic Acid (VITAMIN C PO) Take 1 tablet by mouth daily.      Marland Kitchen. b complex vitamins tablet Take 1 tablet by mouth daily.      . Cholecalciferol (VITAMIN D-3 PO) Take 1 tablet by mouth daily.      Marland Kitchen. GARLIC PO Take 1 tablet by mouth daily.      . IRON PO Take 1 tablet by mouth daily.      . ranitidine (ZANTAC) 150 MG tablet Take 300 mg by mouth daily.       No current facility-administered medications on file prior to visit.   History reviewed. No pertinent family history. History   Social History  . Marital Status: Married    Spouse Name: N/A    Number of Children: N/A  . Years of Education: N/A   Occupational History  . Works for [employer]    Social History Main Topics  . Smoking status: Never Smoker   . Smokeless tobacco: Not on file  . Alcohol Use: No  . Drug Use: No  . Sexual Activity: Not  on file   Other Topics Concern  . 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    Review of Systems  Constitutional: Negative for fever, chills, diaphoresis, activity change, appetite change and fatigue.  HENT: Negative for ear pain, nosebleeds, congestion, facial swelling, rhinorrhea, neck pain, neck stiffness and ear discharge.   Eyes: Negative for pain, discharge, redness, itching and visual disturbance.  Respiratory: Negative for cough, choking, chest tightness, shortness of breath, wheezing and stridor.   Cardiovascular: Negative for chest pain, palpitations and leg swelling.  Gastrointestinal: Negative for abdominal distention.  Genitourinary: Negative for dysuria, urgency, frequency, hematuria, flank pain, decreased urine volume, difficulty urinating and dyspareunia.  Musculoskeletal: Negative for back pain, joint swelling, arthralgias and gait problem.  Neurological: Negative for dizziness, tremors, seizures, syncope, facial asymmetry, speech difficulty, weakness, light-headedness, numbness and headaches.  Hematological: Negative for adenopathy. Does not bruise/bleed easily.  Psychiatric/Behavioral: Negative for hallucinations, behavioral problems, confusion, dysphoric mood, decreased concentration and agitation.    Objective:   Filed Vitals:   06/01/13 1417  BP: 121/74  Pulse: 61  Temp: 98 F (36.7 C)  Resp: 16    Physical Exam  Constitutional: Appears well-developed and well-nourished. No distress.  HENT: Normocephalic. External right and left ear normal. Oropharynx is clear and moist.  Eyes: Conjunctivae and EOM are normal. PERRLA, no scleral icterus.  Neck: Normal ROM. Neck supple. No JVD. No tracheal deviation. No thyromegaly.  CVS: RRR, S1/S2 +, no murmurs, no gallops, no carotid bruit.  Pulmonary: Effort and breath sounds normal, no stridor, rhonchi, wheezes, rales.    Lab Results  Component Value Date   WBC 10.5  09/24/2012   HGB 12.1 09/24/2012   HCT 36.1 09/24/2012   MCV 87.6 09/24/2012   PLT 308 09/24/2012   Lab Results  Component Value Date   CREATININE 0.79 09/24/2012   BUN 11 09/24/2012   NA 143 09/24/2012   K 3.7 09/24/2012   CL 105 09/24/2012   CO2 27 09/24/2012    No results found for this basename: HGBA1C   Lipid Panel  No results found for this basename: chol, trig, hdl, cholhdl, vldl, ldlcalc      Assessment and plan:   Patient Active Problem List   Diagnosis Date Noted  . Laryngitis - can not see any physical problems on the exam, no swelling or erythema  - will provide referral to ENT - will check TSH, CMET today as part of the regular physical  09/23/2012

## 2013-06-01 NOTE — Patient Instructions (Signed)
Laryngitis At the top of your windpipe is your voice box. It is the source of your voice. Inside your voice box are 2 bands of muscles called vocal cords. When you breathe, your vocal cords are relaxed and open so that air can get into the lungs. When you decide to say something, these cords come together and vibrate. The sound from these vibrations goes into your throat and comes out through your mouth as sound. Laryngitis is an inflammation of the vocal cords that causes hoarseness, cough, loss of voice, sore throat, and dry throat. Laryngitis can be temporary (acute) or long-term (chronic). Most cases of acute laryngitis improve with time.Chronic laryngitis lasts for more than 3 weeks. CAUSES Laryngitis can often be related to excessive smoking, talking, or yelling, as well as inhalation of toxic fumes and allergies. Acute laryngitis is usually caused by a viral infection, vocal strain, measles or mumps, or bacterial infections. Chronic laryngitis is usually caused by vocal cord strain, vocal cord injury, postnasal drip, growths on the vocal cords, or acid reflux. SYMPTOMS   Cough.  Sore throat.  Dry throat. RISK FACTORS  Respiratory infections.  Exposure to irritating substances, such as cigarette smoke, excessive amounts of alcohol, stomach acids, and workplace chemicals.  Voice trauma, such as vocal cord injury from shouting or speaking too loud. DIAGNOSIS  Your cargiver will perform a physical exam. During the physical exam, your caregiver will examine your throat. The most common sign of laryngitis is hoarseness. Laryngoscopy may be necessary to confirm the diagnosis of this condition. This procedure allows your caregiver to look into the larynx. HOME CARE INSTRUCTIONS  Drink enough fluids to keep your urine clear or pale yellow.  Rest until you no longer have symptoms or as directed by your caregiver.  Breathe in moist air.  Take all medicine as directed by your  caregiver.  Do not smoke.  Talk as little as possible (this includes whispering).  Write on paper instead of talking until your voice is back to normal.  Follow up with your caregiver if your condition has not improved after 10 days. SEEK MEDICAL CARE IF:   You have trouble breathing.  You cough up blood.  You have persistent fever.  You have increasing pain.  You have difficulty swallowing. MAKE SURE YOU:  Understand these instructions.  Will watch your condition.  Will get help right away if you are not doing well or get worse. Document Released: 01/27/2005 Document Revised: 04/21/2011 Document Reviewed: 04/04/2010 [facility] Patient Information 2014 [place], [place]. Allergies Allergies may happen from anything your body is sensitive to. This may be food, medicines, pollens, chemicals, and nearly anything around you in everyday life that produces allergens. An allergen is anything that causes an allergy producing substance. Heredity is often a factor in causing these problems. This means you may have some of the same allergies as your parents. Food allergies happen in all age groups. Food allergies are some of the most severe and life threatening. Some common food allergies are cow's milk, seafood, eggs, nuts, wheat, and soybeans. SYMPTOMS   Swelling around the mouth.  An itchy red rash or hives.  Vomiting or diarrhea.  Difficulty breathing. SEVERE ALLERGIC REACTIONS ARE LIFE-THREATENING. This reaction is called anaphylaxis. It can cause the mouth and throat to swell and cause difficulty with breathing and swallowing. In severe reactions only a trace amount of food (for example, peanut oil in a salad) may cause death within seconds. Seasonal allergies occur in all age groups.  These are seasonal because they usually occur during the same season every year. They may be a reaction to molds, grass pollens, or tree pollens. Other causes of problems are house dust mite allergens,  pet dander, and mold spores. The symptoms often consist of nasal congestion, a runny itchy nose associated with sneezing, and tearing itchy eyes. There is often an associated itching of the mouth and ears. The problems happen when you come in contact with pollens and other allergens. Allergens are the particles in the air that the body reacts to with an allergic reaction. This causes you to release allergic antibodies. Through a chain of events, these eventually cause you to release histamine into the blood stream. Although it is meant to be protective to the body, it is this release that causes your discomfort. This is why you were given anti-histamines to feel better. If you are unable to pinpoint the offending allergen, it may be determined by skin or blood testing. Allergies cannot be cured but can be controlled with medicine. Hay fever is a collection of all or some of the seasonal allergy problems. It may often be treated with simple over-the-counter medicine such as diphenhydramine. Take medicine as directed. Do not drink alcohol or drive while taking this medicine. Check with your caregiver or package insert for child dosages. If these medicines are not effective, there are many new medicines your caregiver can prescribe. Stronger medicine such as nasal spray, eye drops, and corticosteroids may be used if the first things you try do not work well. Other treatments such as immunotherapy or desensitizing injections can be used if all else fails. Follow up with your caregiver if problems continue. These seasonal allergies are usually not life threatening. They are generally more of a nuisance that can often be handled using medicine. HOME CARE INSTRUCTIONS   If unsure what causes a reaction, keep a diary of foods eaten and symptoms that follow. Avoid foods that cause reactions.  If hives or rash are present:  Take medicine as directed.  You may use an over-the-counter antihistamine (diphenhydramine)  for hives and itching as needed.  Apply cold compresses (cloths) to the skin or take baths in cool water. Avoid hot baths or showers. Heat will make a rash and itching worse.  If you are severely allergic:  Following a treatment for a severe reaction, hospitalization is often required for closer follow-up.  Wear a medic-alert bracelet or necklace stating the allergy.  You and your family must learn how to give adrenaline or use an anaphylaxis kit.  If you have had a severe reaction, always carry your anaphylaxis kit or EpiPen with you. Use this medicine as directed by your caregiver if a severe reaction is occurring. Failure to do so could have a fatal outcome. SEEK MEDICAL CARE IF:  You suspect a food allergy. Symptoms generally happen within 30 minutes of eating a food.  Your symptoms have not gone away within 2 days or are getting worse.  You develop new symptoms.  You want to retest yourself or your child with a food or drink you think causes an allergic reaction. Never do this if an anaphylactic reaction to that food or drink has happened before. Only do this under the care of a caregiver. SEEK IMMEDIATE MEDICAL CARE IF:   You have difficulty breathing, are wheezing, or have a tight feeling in your chest or throat.  You have a swollen mouth, or you have hives, swelling, or itching all over your body.  You have had a severe reaction that has responded to your anaphylaxis kit or an EpiPen. These reactions may return when the medicine has worn off. These reactions should be considered life threatening. MAKE SURE YOU:   Understand these instructions.  Will watch your condition.  Will get help right away if you are not doing well or get worse. Document Released: 04/22/2002 Document Revised: 05/24/2012 Document Reviewed: 09/27/2007 [facility] Patient Information 2014 [place].

## 2013-06-02 LAB — TSH: TSH: 2.263 u[IU]/mL (ref 0.350–4.500)

## 2013-06-07 ENCOUNTER — Ambulatory Visit: Payer: 59

## 2013-06-07 ENCOUNTER — Ambulatory Visit: Payer: No Typology Code available for payment source | Attending: Internal Medicine | Admitting: *Deleted

## 2013-06-07 ENCOUNTER — Telehealth: Payer: Self-pay | Admitting: *Deleted

## 2013-06-07 VITALS — BP 145/83 | HR 71 | Temp 98.3°F | Resp 16 | Ht 65.0 in | Wt 154.0 lb

## 2013-06-07 DIAGNOSIS — N390 Urinary tract infection, site not specified: Secondary | ICD-10-CM

## 2013-06-07 LAB — POCT URINALYSIS DIPSTICK
BILIRUBIN UA: NEGATIVE
Glucose, UA: NEGATIVE
KETONES UA: NEGATIVE
LEUKOCYTES UA: NEGATIVE
Nitrite, UA: NEGATIVE
PH UA: 7
Protein, UA: NEGATIVE
SPEC GRAV UA: 1.015
Urobilinogen, UA: 0.2

## 2013-06-07 NOTE — Progress Notes (Unsigned)
Pt in today complaining of urinary symptoms. Patient states she had kidney pain yesterday. UA done (trace blood). Informed patient we will send urine off for culture. Informed patient if she feels worse to call back to be seen. Reather LaurenceJamie R Malaysia Crance, RN

## 2013-06-07 NOTE — Telephone Encounter (Signed)
Received a Engineer, technical salesvoicemail on [employer]. Patient reports she is having urinary symptoms and the symptoms are worsening. Patient given a nurse appointment for [id number] at 4:45PM. Reather LaurenceJamie R Dainelle Hun, RN

## 2013-06-08 LAB — URINE CULTURE
Colony Count: NO GROWTH
Organism ID, Bacteria: NO GROWTH

## 2013-06-09 ENCOUNTER — Telehealth: Payer: Self-pay | Admitting: Emergency Medicine

## 2013-06-09 MED ORDER — SIMVASTATIN 40 MG PO TABS: 40.0000 mg | ORAL_TABLET | Freq: Every day | ORAL | Status: DC

## 2013-06-09 NOTE — Telephone Encounter (Signed)
Verbal order given to prescribe Zocor 40 mg tab daily per Dr. Izola PriceMyers. Pt informed lab results with medication instructions to [facility]

## 2013-06-13 ENCOUNTER — Telehealth: Payer: Self-pay | Admitting: Internal Medicine

## 2013-06-13 ENCOUNTER — Telehealth: Payer: Self-pay | Admitting: Emergency Medicine

## 2013-06-13 NOTE — Telephone Encounter (Signed)
Pt called in for lab results related to continued sx extreme thirst with weakness. Informed pt TSH normal and Glucose. Please f/u Pt states you spoke to her regarding possible thyroid scan for throat problems

## 2013-06-13 NOTE — Telephone Encounter (Signed)
Pt received results and would like to speak to clinician in regards of next steps.  Please f/u with pt.

## 2013-06-16 ENCOUNTER — Telehealth: Payer: Self-pay | Admitting: *Deleted

## 2013-06-16 NOTE — Telephone Encounter (Signed)
Returned patient call in regards to ENT appointment. Left a message for patient to call Arna Mediciora (referral coordinator) with phone number.

## 2013-07-05 ENCOUNTER — Encounter: Payer: Self-pay | Admitting: Internal Medicine

## 2013-07-05 ENCOUNTER — Ambulatory Visit: Payer: No Typology Code available for payment source | Attending: Internal Medicine | Admitting: Internal Medicine

## 2013-07-05 VITALS — BP 122/73 | HR 80 | Temp 98.4°F | Resp 14 | Ht 65.0 in | Wt 150.6 lb

## 2013-07-05 DIAGNOSIS — J329 Chronic sinusitis, unspecified: Secondary | ICD-10-CM | POA: Insufficient documentation

## 2013-07-05 DIAGNOSIS — I059 Rheumatic mitral valve disease, unspecified: Secondary | ICD-10-CM | POA: Insufficient documentation

## 2013-07-05 LAB — POCT RAPID STREP A (OFFICE): RAPID STREP A SCREEN: NEGATIVE

## 2013-07-05 MED ORDER — AMOXICILLIN 500 MG PO CAPS: 500.0000 mg | ORAL_CAPSULE | Freq: Three times a day (TID) | ORAL | Status: DC

## 2013-07-05 NOTE — Patient Instructions (Signed)

## 2013-07-05 NOTE — Progress Notes (Signed)
Patient here today stating she is having throat and sinus congestion, sore throat, and pain all over. Patient states these symptoms started Sunday afternoon.

## 2013-07-05 NOTE — Progress Notes (Signed)
Patient ID: Teresa Mcdaniel, female   DOB: 05/11/1955, 58 y.o.   MRN: [id number] CC: sore throat  HPI: Patient reports that on Sunday afternoon she took a deep breath and felt something pop in ribs.  Now having occassional pain.  Reports having sore throat and head and chest congestion.  She reports body aches, malaise, cough, runny nose with clear drainage, chills, and headaches.  She denies fevers. She reports enlarged lymph nodes in throat.    Allergies  Allergen Reactions  . Codeine Nausea Only  . Levaquin [Levofloxacin In D5w]   . Lodine [Etodolac]   . Nsaids Other (See Comments)    Stomach problems  . Tetracyclines & Related Swelling  . Sulfa Antibiotics Swelling   Past Medical History  Diagnosis Date  . Mitral valve prolapse     Dx at time of pericarditis  . Interstitial cystitis   . Gastritis before 2000  . Pericarditis 1990 or before   Current Outpatient Prescriptions on File Prior to Visit  Medication Sig Dispense Refill  . Ascorbic Acid (VITAMIN C PO) Take 1 tablet by mouth daily.      Marland Kitchen. b complex vitamins tablet Take 1 tablet by mouth daily.      . Cholecalciferol (VITAMIN D-3 PO) Take 1 tablet by mouth daily.      Marland Kitchen. GARLIC PO Take 1 tablet by mouth daily.      . IRON PO Take 1 tablet by mouth daily.      Marland Kitchen. loratadine (CLARITIN) 10 MG tablet Take 10 mg by mouth daily as needed for allergies.      . naproxen sodium (ANAPROX) 220 MG tablet Take 220 mg by mouth daily as needed (for pain).      . ranitidine (ZANTAC) 150 MG tablet Take 300 mg by mouth daily.      . simvastatin (ZOCOR) 40 MG tablet Take 1 tablet (40 mg total) by mouth at bedtime.  90 tablet  3   No current facility-administered medications on file prior to visit.   No family history on file. History   Social History  . Marital Status: Married    Spouse Name: N/A    Number of Children: N/A  . Years of Education: N/A   Occupational History  . Works for [employer]    Social History Main Topics   . Smoking status: Never Smoker   . Smokeless tobacco: Not on file  . Alcohol Use: No  . Drug Use: No  . Sexual Activity: Not on file   Other Topics Concern  . 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    Review of Systems: See HPI  Objective:   Filed Vitals:   07/05/13 1113  BP: 122/73  Pulse: 80  Temp: 98.4 F (36.9 C)  Resp: 14   Physical Exam  Vitals reviewed. Constitutional: She is oriented to person, place, and time. She appears well-developed.  HENT:  Right Ear: External ear normal.  Left Ear: External ear normal.  Nose: Nose normal.  Mouth/Throat: No oropharyngeal exudate.  Erythematous oropharynx Tender frontal, ethmoid sinuses   Eyes: Conjunctivae and EOM are normal. Pupils are equal, round, and reactive to light.  Neck: Normal range of motion. Neck supple.  Cardiovascular: Normal rate, regular rhythm and normal heart sounds.   Pulmonary/Chest: Effort normal and breath sounds normal. She has no wheezes.  Abdominal: Soft. Bowel sounds are normal. There is no tenderness.  Musculoskeletal: Normal range of motion.  Lymphadenopathy:    She has cervical adenopathy.  Neurological: She is alert and oriented to person, place, and time.  Skin: Skin is warm and dry.     Lab Results  Component Value Date   WBC 9.8 06/01/2013   HGB 13.1 06/01/2013   HCT 38.3 06/01/2013   MCV 86.3 06/01/2013   PLT 387 06/01/2013   Lab Results  Component Value Date   CREATININE 0.77 06/01/2013   BUN 13 06/01/2013   NA 139 06/01/2013   K 4.1 06/01/2013   CL 102 06/01/2013   CO2 28 06/01/2013    No results found for this basename: HGBA1C   Lipid Panel     Component Value Date/Time   CHOL 326* 06/01/2013 1442   TRIG 170* 06/01/2013 1442   HDL 48 06/01/2013 1442   CHOLHDL 6.8 06/01/2013 1442   VLDL 34 06/01/2013 1442   LDLCALC 244* 06/01/2013 1442       Assessment and plan:   Teresa Mcdaniel was seen today for nasal  congestion.  Diagnoses and associated orders for this visit:  Unspecified sinusitis (chronic) - Ambulatory referral to ENT - Rapid Strep A - Throat culture ([facility]) - amoxicillin (AMOXIL) 500 MG capsule; Take 1 capsule (500 mg total) by mouth 3 (three) times daily.  Return if symptoms worsen or fail to improve.   Teresa Finland, NP-C [facility] [phone] 07/05/2013, 11:21 AM

## 2013-07-06 ENCOUNTER — Other Ambulatory Visit: Payer: Self-pay | Admitting: Internal Medicine

## 2013-07-06 ENCOUNTER — Telehealth: Payer: Self-pay | Admitting: *Deleted

## 2013-07-06 DIAGNOSIS — J329 Chronic sinusitis, unspecified: Secondary | ICD-10-CM

## 2013-07-06 MED ORDER — AMOXICILLIN-POT CLAVULANATE 500-125 MG PO TABS: 1.0000 | ORAL_TABLET | Freq: Two times a day (BID) | ORAL | Status: DC

## 2013-07-06 MED ORDER — AMOXICILLIN-POT CLAVULANATE 875-125 MG PO TABS: 1.0000 | ORAL_TABLET | Freq: Two times a day (BID) | ORAL | Status: DC

## 2013-07-06 NOTE — Telephone Encounter (Signed)
Informed patient that per Teresa Gash, NP Amoxicillin will help sinusitis. Patient informed that requested Augmentin prescription is at our pharmacy for her to pick up. Reather Laurence, RN

## 2013-07-06 NOTE — Telephone Encounter (Signed)
Patient called today stating that she was prescribed Amoxicillin yesterday 07/05/2013. Patient states she called the pharmacy to see if this will help with respiratory infection and the pharmacy told her its for dental. Patient states she would like to be prescribed Augmentin per pharmacy. Reather Laurence, RN

## 2013-07-06 NOTE — Telephone Encounter (Signed)
Please let patient know amoxicillin will help sinusitis but I have sent her Augmentin to our pharmacy

## 2013-07-07 ENCOUNTER — Other Ambulatory Visit: Payer: Self-pay | Admitting: Internal Medicine

## 2013-07-07 LAB — CULTURE, GROUP A STREP: ORGANISM ID, BACTERIA: NORMAL

## 2013-07-07 MED ORDER — FLUCONAZOLE 150 MG PO TABS: 150.0000 mg | ORAL_TABLET | Freq: Once | ORAL | Status: DC

## 2013-07-12 ENCOUNTER — Ambulatory Visit: Payer: 59

## 2013-07-12 ENCOUNTER — Telehealth: Payer: Self-pay | Admitting: *Deleted

## 2013-07-12 NOTE — Telephone Encounter (Signed)
Patient called today saying she was bit by a deer tick in her groin area. Patient complains now her groin area is burning and hard. Patient is already on Augmentin for sinusitis. Patient requesting [facility] call her in a prescription for Doxycycline. Patient informed she will need to come in and see a provider. Patient scheduled for tomorrow (07/12/2013) at 10:00 am. Informed patient if she gets any flu like symptoms or rash to go to Urgent Care or ED. Patient verbalized understanding. Reather Laurence, RN

## 2013-07-13 ENCOUNTER — Ambulatory Visit: Payer: No Typology Code available for payment source | Attending: Internal Medicine

## 2013-07-13 VITALS — BP 119/75 | HR 69 | Temp 97.9°F | Resp 20 | Ht 65.0 in | Wt 155.0 lb

## 2013-07-13 DIAGNOSIS — W57XXXA Bitten or stung by nonvenomous insect and other nonvenomous arthropods, initial encounter: Secondary | ICD-10-CM

## 2013-07-13 LAB — CBC WITH DIFFERENTIAL/PLATELET
BASOS ABS: 0.1 10*3/uL (ref 0.0–0.1)
BASOS PCT: 1 % (ref 0–1)
EOS PCT: 3 % (ref 0–5)
Eosinophils Absolute: 0.3 10*3/uL (ref 0.0–0.7)
HEMATOCRIT: 36.3 % (ref 36.0–46.0)
Hemoglobin: 12.2 g/dL (ref 12.0–15.0)
Lymphocytes Relative: 39 % (ref 12–46)
Lymphs Abs: 3.3 10*3/uL (ref 0.7–4.0)
MCH: 28.9 pg (ref 26.0–34.0)
MCHC: 33.6 g/dL (ref 30.0–36.0)
MCV: 86 fL (ref 78.0–100.0)
MONO ABS: 0.4 10*3/uL (ref 0.1–1.0)
Monocytes Relative: 5 % (ref 3–12)
NEUTROS ABS: 4.4 10*3/uL (ref 1.7–7.7)
Neutrophils Relative %: 52 % (ref 43–77)
Platelets: 482 10*3/uL — ABNORMAL HIGH (ref 150–400)
RBC: 4.22 MIL/uL (ref 3.87–5.11)
RDW: 13.5 % (ref 11.5–15.5)
WBC: 8.4 10*3/uL (ref 4.0–10.5)

## 2013-07-13 NOTE — Patient Instructions (Signed)
We will inform you of your lab results when the results are in.  It was nice seeing you today. Take care!

## 2013-07-13 NOTE — Progress Notes (Unsigned)
Patient comes in indicating she found a tick on her right groin yesterday. Patient indicates area had a bullseye rash. Site examined with Arvin Collard, LPN. No bullseye rash noted. Patient informed to continue to monitor area for bullseye rash.  Watch for symptoms such as fatigue, fever, muscle aches. Lyme labs ordered.

## 2013-07-14 LAB — B. BURGDORFI ANTIBODIES BY WB
B BURGDORFERI IGM ABS (IB): NEGATIVE
B burgdorferi IgG Abs (IB): NEGATIVE

## 2013-09-07 ENCOUNTER — Ambulatory Visit: Payer: No Typology Code available for payment source | Attending: Internal Medicine

## 2013-09-07 VITALS — BP 119/76 | HR 72 | Temp 97.6°F | Resp 16

## 2013-09-07 DIAGNOSIS — N390 Urinary tract infection, site not specified: Secondary | ICD-10-CM

## 2013-09-07 LAB — POCT URINALYSIS DIPSTICK
Bilirubin, UA: NEGATIVE
Glucose, UA: NEGATIVE
Ketones, UA: NEGATIVE
Nitrite, UA: POSITIVE
PROTEIN UA: NEGATIVE
SPEC GRAV UA: 1.015
UROBILINOGEN UA: 0.2
pH, UA: 6

## 2013-09-07 MED ORDER — CEPHALEXIN 500 MG PO CAPS: 500.0000 mg | ORAL_CAPSULE | Freq: Two times a day (BID) | ORAL | Status: DC

## 2013-09-07 MED ORDER — NITROFURANTOIN MONOHYD MACRO 100 MG PO CAPS: 100.0000 mg | ORAL_CAPSULE | Freq: Two times a day (BID) | ORAL | Status: DC

## 2013-09-07 MED ORDER — FLUCONAZOLE 200 MG PO TABS: 200.0000 mg | ORAL_TABLET | Freq: Every day | ORAL | Status: DC

## 2013-09-07 NOTE — Patient Instructions (Signed)

## 2013-09-07 NOTE — Progress Notes (Unsigned)
Patient here today with c/o pain and burning with urination since Saturday. Patient also c/o lower back also. UA ordered. Patient urine positive for Large leukocytes, nitrites, and blood. Consulted with Dr. Hyman HopesJegede. Verbal orders received for Keflex, Macrobid, and Diflucan. [name],Teddrick Mallari Renee, RN

## 2013-09-22 ENCOUNTER — Telehealth: Payer: Self-pay | Admitting: Internal Medicine

## 2013-09-22 NOTE — Telephone Encounter (Signed)
Pt refers problems with urination: pain, incontinence Please f/u with Patient

## 2013-09-23 NOTE — Telephone Encounter (Signed)
I put the order in that day I wonder if the lab threw out the urine. Does she need to do another urine and we just send it for culture?

## 2013-09-23 NOTE — Telephone Encounter (Signed)
What happened to the urine culture for this patient?

## 2013-09-23 NOTE — Telephone Encounter (Signed)
Patient states she is still having pain with urination, frequent urination, and incontinence. Patient was prescribed Keflex and Macrobid on 09/07/2013. Patient does have multiple allergies. Should we try another antibiotic or refer patient out. Please advise

## 2013-09-26 NOTE — Telephone Encounter (Signed)
Yes please obtain urine culture on this patient

## 2013-09-27 ENCOUNTER — Telehealth: Payer: Self-pay | Admitting: *Deleted

## 2013-09-27 NOTE — Telephone Encounter (Signed)
Returned patient call and informed her she needs to come in to provide a urine sample for culture. Patient states she will try to come in today. Teresa Mcdaniel,Teresa Awan Renee, RN

## 2013-09-28 ENCOUNTER — Encounter: Payer: Self-pay | Admitting: *Deleted

## 2013-09-28 ENCOUNTER — Ambulatory Visit: Payer: 59

## 2013-09-28 DIAGNOSIS — N390 Urinary tract infection, site not specified: Secondary | ICD-10-CM

## 2013-09-28 NOTE — Progress Notes (Unsigned)
Patient presents still having UTI symptoms after being treated. Results for urine culture were not received. Patient to give  Urine sample and send off for culture. [name],[name], RN

## 2013-09-29 LAB — URINE CULTURE
COLONY COUNT: NO GROWTH
ORGANISM ID, BACTERIA: NO GROWTH

## 2013-10-03 ENCOUNTER — Telehealth: Payer: Self-pay | Admitting: Emergency Medicine

## 2013-10-03 ENCOUNTER — Telehealth: Payer: Self-pay | Admitting: *Deleted

## 2013-10-03 DIAGNOSIS — N390 Urinary tract infection, site not specified: Secondary | ICD-10-CM

## 2013-10-03 NOTE — Telephone Encounter (Signed)
Message copied by Darlis Loan on Mon Oct 03, 2013  5:03 PM ------      Message from: [place] A      Created: Fri Sep 30, 2013  9:19 AM       Patient does not have any bacteria in urine. If she is still having symptoms you may refer her to urology ------

## 2013-10-03 NOTE — Telephone Encounter (Signed)
Called patient to inform her of negative bacteria in urine culture. Patient is still having pain with urination, urgency, and swelling in abdomen. Informed patient per Cletus Gash, NP we will send her to urology. Patient verbalized understanding regarding plan of care.Annamaria Helling, RN

## 2013-10-03 NOTE — Telephone Encounter (Signed)
Left message for pt to call when message received 

## 2013-10-06 ENCOUNTER — Telehealth: Payer: Self-pay | Admitting: Emergency Medicine

## 2013-10-06 NOTE — Telephone Encounter (Signed)
Pt made aware we placed Urology referral 10/03/13 for continued abdominal swelling with UTI sx's

## 2014-10-19 ENCOUNTER — Emergency Department (HOSPITAL_COMMUNITY)
Admission: EM | Admit: 2014-10-19 | Discharge: 2014-10-20 | Disposition: A | Discharge status: Home / Self Care | Payer: 59 | Attending: Emergency Medicine | Admitting: Emergency Medicine

## 2014-10-19 ENCOUNTER — Emergency Department (HOSPITAL_COMMUNITY): Payer: 59

## 2014-10-19 ENCOUNTER — Encounter (HOSPITAL_COMMUNITY): Payer: Self-pay | Admitting: Emergency Medicine

## 2014-10-19 DIAGNOSIS — S29001A Unspecified injury of muscle and tendon of front wall of thorax, initial encounter: Secondary | ICD-10-CM | POA: Insufficient documentation

## 2014-10-19 DIAGNOSIS — S3991XA Unspecified injury of abdomen, initial encounter: Secondary | ICD-10-CM | POA: Insufficient documentation

## 2014-10-19 DIAGNOSIS — S0181XA Laceration without foreign body of other part of head, initial encounter: Secondary | ICD-10-CM | POA: Insufficient documentation

## 2014-10-19 DIAGNOSIS — M797 Fibromyalgia: Secondary | ICD-10-CM | POA: Insufficient documentation

## 2014-10-19 DIAGNOSIS — Z79899 Other long term (current) drug therapy: Secondary | ICD-10-CM | POA: Insufficient documentation

## 2014-10-19 DIAGNOSIS — Y92410 Unspecified street and highway as the place of occurrence of the external cause: Secondary | ICD-10-CM | POA: Insufficient documentation

## 2014-10-19 DIAGNOSIS — Y9389 Activity, other specified: Secondary | ICD-10-CM | POA: Insufficient documentation

## 2014-10-19 DIAGNOSIS — R52 Pain, unspecified: Secondary | ICD-10-CM

## 2014-10-19 DIAGNOSIS — Y998 Other external cause status: Secondary | ICD-10-CM | POA: Insufficient documentation

## 2014-10-19 HISTORY — DX: Fibromyalgia: M79.7

## 2014-10-19 LAB — CBC WITH DIFFERENTIAL/PLATELET
Basophils Absolute: 0 10*3/uL (ref 0.0–0.1)
Basophils Relative: 0 % (ref 0–1)
EOS PCT: 0 % (ref 0–5)
Eosinophils Absolute: 0.1 10*3/uL (ref 0.0–0.7)
HEMATOCRIT: 41.3 % (ref 36.0–46.0)
Hemoglobin: 14 g/dL (ref 12.0–15.0)
LYMPHS ABS: 3.3 10*3/uL (ref 0.7–4.0)
LYMPHS PCT: 18 % (ref 12–46)
MCH: 29.9 pg (ref 26.0–34.0)
MCHC: 33.9 g/dL (ref 30.0–36.0)
MCV: 88.2 fL (ref 78.0–100.0)
MONO ABS: 0.7 10*3/uL (ref 0.1–1.0)
Monocytes Relative: 4 % (ref 3–12)
Neutro Abs: 14.1 10*3/uL — ABNORMAL HIGH (ref 1.7–7.7)
Neutrophils Relative %: 78 % — ABNORMAL HIGH (ref 43–77)
PLATELETS: 342 10*3/uL (ref 150–400)
RBC: 4.68 MIL/uL (ref 3.87–5.11)
RDW: 13.4 % (ref 11.5–15.5)
WBC: 18.2 10*3/uL — ABNORMAL HIGH (ref 4.0–10.5)

## 2014-10-19 LAB — I-STAT CHEM 8, ED
BUN: 15 mg/dL (ref 6–20)
CHLORIDE: 101 mmol/L (ref 101–111)
CREATININE: 0.8 mg/dL (ref 0.44–1.00)
Calcium, Ion: 1.11 mmol/L — ABNORMAL LOW (ref 1.13–1.30)
GLUCOSE: 119 mg/dL — AB (ref 65–99)
HCT: 44 % (ref 36.0–46.0)
Hemoglobin: 15 g/dL (ref 12.0–15.0)
POTASSIUM: 3.3 mmol/L — AB (ref 3.5–5.1)
Sodium: 140 mmol/L (ref 135–145)
TCO2: 24 mmol/L (ref 0–100)

## 2014-10-19 LAB — URINALYSIS, ROUTINE W REFLEX MICROSCOPIC
Bilirubin Urine: NEGATIVE
GLUCOSE, UA: NEGATIVE mg/dL
Hgb urine dipstick: NEGATIVE
KETONES UR: NEGATIVE mg/dL
LEUKOCYTES UA: NEGATIVE
Nitrite: NEGATIVE
PH: 7 (ref 5.0–8.0)
Protein, ur: NEGATIVE mg/dL
SPECIFIC GRAVITY, URINE: 1.025 (ref 1.005–1.030)
Urobilinogen, UA: 0.2 mg/dL (ref 0.0–1.0)

## 2014-10-19 MED ORDER — PENICILLIN V POTASSIUM 250 MG PO TABS
500.0000 mg | ORAL_TABLET | Freq: Once | ORAL | Status: AC
Start: 2014-10-20 — End: 2014-10-20
  Administered 2014-10-20: 500 mg via ORAL
  Filled 2014-10-19: qty 2

## 2014-10-19 MED ORDER — MORPHINE SULFATE (PF) 4 MG/ML IV SOLN
4.0000 mg | Freq: Once | INTRAVENOUS | Status: AC
  Administered 2014-10-19: 4 mg via INTRAVENOUS
  Filled 2014-10-19: qty 1

## 2014-10-19 MED ORDER — IOHEXOL 300 MG/ML  SOLN
100.0000 mL | Freq: Once | INTRAMUSCULAR | Status: AC | PRN
  Administered 2014-10-19: 100 mL via INTRAVENOUS

## 2014-10-19 MED ORDER — POTASSIUM CHLORIDE CRYS ER 20 MEQ PO TBCR
40.0000 meq | EXTENDED_RELEASE_TABLET | Freq: Once | ORAL | Status: AC
  Administered 2014-10-20: 40 meq via ORAL
  Filled 2014-10-19: qty 2

## 2014-10-19 MED ORDER — SODIUM CHLORIDE 0.9 % IV BOLUS (SEPSIS)
1000.0000 mL | Freq: Once | INTRAVENOUS | Status: AC
  Administered 2014-10-19: 1000 mL via INTRAVENOUS

## 2014-10-19 MED ORDER — ONDANSETRON HCL 4 MG/2ML IJ SOLN
4.0000 mg | Freq: Once | INTRAMUSCULAR | Status: AC
  Administered 2014-10-19: 4 mg via INTRAVENOUS
  Filled 2014-10-19: qty 2

## 2014-10-19 MED ORDER — HYDROCODONE-ACETAMINOPHEN 5-325 MG PO TABS
1.0000 | ORAL_TABLET | Freq: Once | ORAL | Status: AC
  Administered 2014-10-20: 1 via ORAL
  Filled 2014-10-19: qty 1
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
## 2014-10-19 NOTE — ED Notes (Signed)
BP dropped after admin of morphine, start bolus NS

## 2014-10-19 NOTE — ED Notes (Signed)
Pt reports nausea after admin of morphine, verbal to give  zofran

## 2014-10-20 MED ORDER — PENICILLIN V POTASSIUM 500 MG PO TABS: 500.0000 mg | ORAL_TABLET | Freq: Three times a day (TID) | ORAL | Status: DC

## 2014-10-20 MED ORDER — HYDROCODONE-ACETAMINOPHEN 5-325 MG PO TABS: 1.0000 | ORAL_TABLET | Freq: Four times a day (QID) | ORAL | Status: DC | PRN

## 2014-10-20 NOTE — Discharge Instructions (Signed)
Take Tylenol for mild pain or the pain medicine prescribed for back pain. Don't take Tylenol with the pain medicine prescribed together as the combination could be dangerous to your liver. Call any of the dentists on the resource guide tomorrow or Monday, 10/23/2014 to arrange to get your mouth recheck next week. Wash the wound under chin every day with soap and water and then place a thin layer of bacitracin ointment over the wound. Place an ice pack on her chin or any swollen, painful area 4 times daily 30 minutes at a time. Return if concerned for any reason.  Emergency Department Resource Guide 1) Find a Doctor and Pay Out of Pocket Although you won't have to find out who is covered by your insurance plan, it is a good idea to ask around and get recommendations. You will then need to call the office and see if the doctor you have chosen will accept you as a new patient and what types of options they offer for patients who are self-pay. Some doctors offer discounts or will set up payment plans for their patients who do not have insurance, but you will need to ask so you aren't surprised when you get to your appointment.  2) Contact Your Local Health Department Not all health departments have doctors that can see patients for sick visits, but many do, so it is worth a call to see if yours does. If you don't know where your local health department is, you can check in your phone book. The CDC also has a tool to help you locate your state's health department, and many state websites also have listings of all of their local health departments.  3) Find a Walk-in Clinic If your illness is not likely to be very severe or complicated, you may want to try a walk in clinic. These are popping up all over the country in pharmacies, drugstores, and shopping centers. They're usually staffed by nurse practitioners or physician assistants that have been trained to treat common illnesses and complaints. They're usually  fairly quick and inexpensive. However, if you have serious medical issues or chronic medical problems, these are probably not your best option.  No Primary Care Doctor: - Call Health Connect at  [phone] - they can help you locate a primary care doctor that  accepts your insurance, provides certain services, etc. - Physician Referral Service- [phone]  Chronic Pain Problems: Organization         Address  Phone   Notes  [facility]  [phone] Patients need to be referred by their primary care doctor.   Medication Assistance: Organization         Address  Phone   Notes  [facility] Medication [facility] [address] [place], [place] [address] [phone] --Must be a resident of [facility] -- Must have NO insurance coverage whatsoever (no Medicaid/ Medicare, etc.) -- The pt. MUST have a primary care doctor that directs their care regularly and follows them in the community   [facility]ist  [phone]   [employer]  [phone]    Agencies that provide inexpensive medical care: Organization         Address  Phone   Notes  [facility]  [phone]   [facility]    [phone]   [facility] [address] [place], [place] [address] [phone]   [facility]  [place] [phone]   Planned Parenthood    [phone]   [facility]    [phone]   [place] and [place] [address], [place] Phone:  [phone], Fax:  [phone] Hours of Operation:  9 am - 6 pm, M-F.  Also accepts Medicaid/Medicare and self-pay.  [facility] for [address], [place] Phone: [phone], Fax: [phone]. Hours of Operation:  8:30 am - 5:30 pm, M-F.  Also accepts Medicaid and self-pay.  [facility] [place] [address], [place] Phone: [phone]   [place], [place], [place] [phone], Ext. [phone] Mondays & Thursdays: 7-9 AM.  First 15 patients are seen on a first come, first serve basis.    [place] Providers:  Organization         Address  Phone   Notes  [facility] [address], [place] [phone] Also accepts self-pay patients.  [facility] [place] [id number] [place], [place]  [phone]   [place], [address], [place] [phone]   [facility] [address] [place] [phone]   Lucianne Lei [address], [place]   [phone] Only accepts [place] Access [place] patients after they have their name applied to their card.   Self-Pay (no insurance) in [facility]:  Organization         Address  Phone   Notes  Sickle Cell Patients, [facility] [place] [phone]   [facility] [place] [phone]   [facility] [place]  [place], [place], [place] [phone]   [facility]/Dr. Osei-Bonsu  [address], [place] or [address], [place] [phone] Phone number for both [place] and [place] locations is the same.  [facility] [address] [place] [phone]   [facility] [address], [place] or [address] [phone]   [facility] [address] [place] [phone], phone; [phone], fax Sees patients 1st and 3rd Saturday of every month.  Must not qualify for public or private insurance (i.e. Medicaid, Medicare, [place] Health Choice, Veterans' Benefits)  Household income should be no more than 200% of the poverty level The clinic cannot treat you if  you are pregnant or think you are pregnant  Sexually transmitted diseases are not treated at the clinic.    Dental Care: Organization         Address  Phone  Notes  [facility] Department of [facility] [phone] Accepts children up to age 71 who are enrolled in [place] or [place]; pregnant women with a Medicaid card; and children who have applied for Medicaid or [place] Health Choice, but were declined, whose parents can pay a reduced fee at time of service.  [facility] Department of [facility]  [address], [place] [phone] Accepts children up to age 61 who are enrolled in [place] or [place]; pregnant women with a Medicaid card; and children who have applied for  Medicaid or [place] Health Choice, but were declined, whose parents can pay a reduced fee at time of service.  [place] [facility] Access PROGRAM  [place] [phone] Patients are seen by appointment only. Walk-ins are not accepted. [place] will see patients 4 years of age and older. Monday - Tuesday (8am-5pm) Most Wednesdays (8:30-5pm) $30 per visit, cash only  [facility] Adult Dental Access PROGRAM  [address] [place] [address], [facility] [phone] Patients are seen by appointment only. Walk-ins are not accepted. [place] will see patients 35 years of age and older. One Wednesday Evening (Monthly: Volunteer Based).  $30 per visit, cash only  [place]  [phone] for adults; Children under age 68, call Graduate Pediatric Dentistry at [phone]. Children aged 66-14, please call [phone] to request a pediatric application.  Dental services are provided in all areas of dental care including fillings, crowns and bridges, complete and partial dentures, implants, gum treatment, root canals, and extractions. Preventive care is also provided. Treatment is  provided to both adults and children. Patients are selected via a lottery and there is often a waiting list.   [facility] [address], [place]  [phone] [email or web address]   [facility] [address] [place], [place] [phone], Ext. [phone] Second and Fourth Thursday of each month, opens at 6:30 AM; Clinic ends at 9 AM.  Patients are seen on a first-come first-served basis, and a limited number are seen during each clinic.   [facility]  [address] Hillard Danker [place], [place] [phone]   Eligibility Requirements You must have lived in [place], [place], or [place] counties for at least the last three months.   You cannot be eligible for state or federal sponsored [employer], including [employer], [place], or [employer].   You generally cannot be eligible for healthcare insurance through your employer.    How to apply: Eligibility screenings are held every Tuesday and Wednesday afternoon from 1:00 pm until 4:00 pm. You do not need an appointment for the interview!  [facility] [address], [place], [place]   [place]  [place] Department  [place]  [phone]    Behavioral Health Resources in the Community: Intensive Outpatient Programs Organization         Address  Phone  Notes  [facility] [place]. [address] [place], [place] [phone]   [facility] Outpatient [address] [place], [place]   ADS: Alcohol & Drug Svcs [address] [place], [place]   [place] [address],  [place], [place] or [phone]   Substance Abuse Resources Organization         Address  Phone  Notes  Alcohol and Drug Services  [phone]   [place]  [phone]   The  [place]   Chinita Pester  [phone]   Residential & Outpatient Substance Abuse Program  [phone]   Psychological Services Organization         Address  Phone  Notes  [facility] [place]  [place]  [phone]   [place] [address] [place] or [phone]    Mobile Crisis Teams Organization         Address  Phone  Notes  [facility], Mobile Crisis Care Unit  [phone]   [facility]  [address] [place], [place] [phone]   [facility] [address] [place] [phone]    Self-Help/Support Groups Organization         Address  Phone             Notes  [facility] [place] - variety of support groups  336- I7437963 Call for more information  Narcotics Anonymous (NA), [facility] [address], [employer] [place]  2 meetings at this location   Statistician         Address  Phone  Notes  [facility] [address],    [place]  [phone]   [facility]  [address], [place] [address], [place], [place] [phone]   [facility] [address] [place], [place] [phone] Admissions: 8am-3pm M-F  [facility] [address],    [place], [place] [phone]   [facility] [address] [place], [place], [place] [phone]   [facility] [address],  [place], [place] [phone]   [facility] - Intensive Outpatient [address], [place], [place] [phone]   [facility] (Add[facility] [address] [place]  [place], [place] [phone] or [phone]   [facility] ([facility]) [address], [place], [place] [phone] Accepts Medicaid  [facility] [place] [address]  [place] [phone] Substance Abuse/Addiction Treatment     [facility] Organization         Address  Phone  Notes  [facility]  [phone]   Angie Fava, PhD [address] Ervin Knack [place], [place]   [phone] or [phone]   [facility]   [address] [place], [place] [phone]   [facility] [address] [place], [place] [phone] Insurance/Medicaid/sponsorship through [facility] and Families [address]                                    [place], [place] [phone] Therapy/tele-psych/case  [facility] [address], [place] [phone]    Dr. Lolly Mustache  [phone]   [facility]  [employer] [facility] Dept. 1) [address], [place] 2) [address] [place] 3)  [address], [place] [phone]  [phone]   [facility] Child Abuse Hotline (606) 412-2399 or [phone] (After Hours)

## 2014-10-27 ENCOUNTER — Emergency Department (HOSPITAL_COMMUNITY)
Admission: EM | Admit: 2014-10-27 | Discharge: 2014-10-27 | Disposition: A | Discharge status: Home / Self Care | Payer: 59 | Attending: Emergency Medicine | Admitting: Emergency Medicine

## 2014-10-27 ENCOUNTER — Emergency Department (HOSPITAL_COMMUNITY): Payer: 59

## 2014-10-27 ENCOUNTER — Encounter (HOSPITAL_COMMUNITY): Payer: Self-pay | Admitting: *Deleted

## 2014-10-27 DIAGNOSIS — Z8719 Personal history of other diseases of the digestive system: Secondary | ICD-10-CM | POA: Insufficient documentation

## 2014-10-27 DIAGNOSIS — Z87448 Personal history of other diseases of urinary system: Secondary | ICD-10-CM | POA: Insufficient documentation

## 2014-10-27 DIAGNOSIS — R531 Weakness: Secondary | ICD-10-CM | POA: Insufficient documentation

## 2014-10-27 DIAGNOSIS — Z8739 Personal history of other diseases of the musculoskeletal system and connective tissue: Secondary | ICD-10-CM | POA: Insufficient documentation

## 2014-10-27 DIAGNOSIS — F0781 Postconcussional syndrome: Secondary | ICD-10-CM

## 2014-10-27 DIAGNOSIS — Z9889 Other specified postprocedural states: Secondary | ICD-10-CM | POA: Insufficient documentation

## 2014-10-27 DIAGNOSIS — Z792 Long term (current) use of antibiotics: Secondary | ICD-10-CM | POA: Insufficient documentation

## 2014-10-27 DIAGNOSIS — S0181XD Laceration without foreign body of other part of head, subsequent encounter: Secondary | ICD-10-CM | POA: Insufficient documentation

## 2014-10-27 DIAGNOSIS — Z79899 Other long term (current) drug therapy: Secondary | ICD-10-CM | POA: Insufficient documentation

## 2014-10-27 DIAGNOSIS — Z8679 Personal history of other diseases of the circulatory system: Secondary | ICD-10-CM | POA: Insufficient documentation
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
## 2014-10-27 NOTE — Discharge Instructions (Signed)
Open Wound, Head An open wound is a break in the skin because of an injury. An open wound can be a scrape, cut, or puncture to the skin. Good wound care will help to:   Reduce pain.  Prevent infection.  Reduce scaring. HOME CARE  Wash all dirt off the wound.  Clean your wound daily with gentle soap and water.  Wash your hair as you normally do.  Apply medicated cream after the wound has been cleaned as told by your doctor.  Apply a clean bandage (dressing) daily if needed. GET HELP RIGHT AWAY IF:   There is increased redness or puffiness (swelling) in or around the wound.  Your pain increases.  You or your child has a temperature by mouth above 102 F (38.9 C), not controlled by medicine.  Your baby is older than 3 months with a rectal temperature of 102 F (38.9 C) or higher.  Your baby is 13 months old or younger with a rectal temperature of 100.4 F (38 C) or higher.  A yellowish white fluid (pus) comes from the wound.  Your pain is not controlled with pain medicine.  There is red streaking of the skin that goes above or below the wound. MAKE SURE YOU:   Understand these instructions.  Will watch your condition.  Will get help right away if you are not doing well or get worse. Document Released: 04/25/2008 Document Revised: 04/21/2011 Document Reviewed: 04/25/2008 [facility] Patient Information 2015 [place], [place]. This information is not intended to replace advice given to you by your health care provider. Make sure you discuss any questions you have with your health care provider.   Post-Concussion Syndrome Post-concussion syndrome describes the symptoms that can occur after a head injury. These symptoms can last from weeks to months. CAUSES  It is not clear why some head injuries cause post-concussion syndrome. It can occur whether your head injury was mild or severe and whether you were wearing head protection or not.  SIGNS AND SYMPTOMS  Memory  difficulties.  Dizziness.  Headaches.  Double vision or blurry vision.  Sensitivity to light.  Hearing difficulties.  Depression.  Tiredness.  Weakness.  Difficulty with concentration.  Difficulty sleeping or staying asleep.  Vomiting.  Poor balance or instability on your feet.  Slow reaction time.  Difficulty learning and remembering things you have heard. DIAGNOSIS  There is no test to determine whether you have post-concussion syndrome. Your health care provider may order an imaging scan of your brain, such as a CT scan, to check for other problems that may be causing your symptoms (such as severe injury inside your skull). TREATMENT  Usually, these problems disappear over time without medical care. Your health care provider may prescribe medicine to help ease your symptoms. It is important to follow up with a neurologist to evaluate your recovery and address any lingering symptoms or issues. HOME CARE INSTRUCTIONS   Only take over-the-counter or prescription medicines for pain, discomfort, or fever as directed by your health care provider. Do not take aspirin. Aspirin can slow blood clotting.  Sleep with your head slightly elevated to help with headaches.  Avoid any situation where there is potential for another head injury (football, hockey, soccer, basketball, martial arts, downhill snow sports, and horseback riding). Your condition will get worse every time you experience a concussion. You should avoid these activities until you are evaluated by the appropriate follow-up health care providers.  Keep all follow-up appointments as directed by your health care  provider. SEEK IMMEDIATE MEDICAL CARE IF:  You develop confusion or unusual drowsiness.  You cannot wake the injured person.  You develop nausea or persistent, forceful vomiting.  You feel like you are moving when you are not (vertigo).  You notice the injured person's eyes moving rapidly back and forth.  This may be a sign of vertigo.  You have convulsions or faint.  You have severe, persistent headaches that are not relieved by medicine.  You cannot use your arms or legs normally.  Your pupils change size.  You have clear or bloody discharge from the nose or ears.  Your problems are getting worse, not better. MAKE SURE YOU:  Understand these instructions.  Will watch your condition.  Will get help right away if you are not doing well or get worse. Document Released: 07/19/2001 Document Revised: 11/17/2012 Document Reviewed: 05/04/2013 [facility] Patient Information 2015 [place], [place]. This information is not intended to replace advice given to you by your health care provider. Make sure you discuss any questions you have with your health care provider.
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
## 2016-06-13 ENCOUNTER — Telehealth (INDEPENDENT_AMBULATORY_CARE_PROVIDER_SITE_OTHER): Payer: Self-pay | Admitting: Vascular Surgery

## 2016-06-13 NOTE — Telephone Encounter (Signed)
LMVM FOR PATIENT TO CALL AND SCHEDULE CAROTID WITH GS

## 2016-09-05 ENCOUNTER — Telehealth (INDEPENDENT_AMBULATORY_CARE_PROVIDER_SITE_OTHER): Payer: Self-pay

## 2016-09-05 ENCOUNTER — Ambulatory Visit
Admission: EM | Admit: 2016-09-05 | Discharge: 2016-09-05 | Disposition: A | Discharge status: Home / Self Care | Payer: 59 | Attending: Family | Admitting: Family

## 2016-09-05 DIAGNOSIS — R8290 Unspecified abnormal findings in urine: Secondary | ICD-10-CM

## 2016-09-05 DIAGNOSIS — R0789 Other chest pain: Secondary | ICD-10-CM

## 2016-09-05 DIAGNOSIS — H9202 Otalgia, left ear: Secondary | ICD-10-CM

## 2016-09-05 DIAGNOSIS — R109 Unspecified abdominal pain: Secondary | ICD-10-CM

## 2016-09-05 DIAGNOSIS — R42 Dizziness and giddiness: Secondary | ICD-10-CM

## 2016-09-05 DIAGNOSIS — R0602 Shortness of breath: Secondary | ICD-10-CM

## 2016-09-05 HISTORY — DX: Hyperlipidemia, unspecified: E78.5

## 2016-09-05 LAB — URINALYSIS, COMPLETE (UACMP) WITH MICROSCOPIC
BACTERIA UA: NONE SEEN
Bilirubin Urine: NEGATIVE
Glucose, UA: NEGATIVE mg/dL
KETONES UR: NEGATIVE mg/dL
Nitrite: NEGATIVE
PH: 7 (ref 5.0–8.0)
Protein, ur: NEGATIVE mg/dL
SPECIFIC GRAVITY, URINE: 1.015 (ref 1.005–1.030)

## 2016-09-05 NOTE — Addendum Note (Signed)
Addended by: Areta HaberMOREHEAD, Marzell Isakson B on: 09/05/2016 05:00 PM   Modules accepted: Orders

## 2016-09-05 NOTE — Discharge Instructions (Signed)
As discussed, my concern is your multiple complaints today and I want to ensure you have an adequate, timely workup. Please go the emergency room, [facility] agreed [facility] Please drive immediately. Do not eat or drink anything.   Please all ensure that you establish with a primary care doctor after you are seen today for ongoing health care.

## 2016-09-05 NOTE — Telephone Encounter (Signed)
Returned patient's call - advised of Urine results, urine will be cultured.  Patient stated she understood and had no other questions.

## 2016-09-05 NOTE — ED Provider Notes (Signed)
CSN: [id number]     Arrival date & time 09/05/16  1029 History   First MD Initiated Contact with Patient 09/05/16 1116     Chief Complaint  Patient presents with  . Sinus Problem  . Otalgia  . Abdominal Pain   (Consider location/radiation/quality/duration/timing/severity/associated sxs/prior Treatment) Chief complaint of sinus congestion, left ear pain x one week.  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 [facility] ( Dr Wyn Quakerew, Dr Lorretta HarpSchneir) this morning and they had no appointments and advised her to been seen at urgent care.   Describes left ear pressure, dizziness.   Endorses chills which started yesterday, left side nasal congestion and sinus pressure, HA.   Describes chest pain, left arm tightness which just noticed earlier today.None at this time. Suspects this is 'skeletal pain' and cannot tell if fibromyalgia.   She also complains of 'severe' abdominal pain which occurred yesterday several times, on this causing her to be doubled over. She points epigastric region. She had no epigastric burning. She does report a change in her bowels and states that they're looser at this time. No dysuria, blood in stool.   No ear discharge, fever, changes in vision,palpitations.  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   No pcp.   H/o MVP. No cardiologist.   States history of fibromyalgia, interstitial cystitis, carotid stenosis.           Past Medical History:  Diagnosis Date  . Fibromyalgia   . Gastritis before 2000  . Hyperlipidemia   . Interstitial cystitis   . Mitral valve prolapse    Dx at time of pericarditis  . Pericarditis 1990 or before   Past Surgical History:  Procedure Laterality Date  . ABDOMINAL HYSTERECTOMY    . CARDIAC CATHETERIZATION  Before 1990   Clean per patient   History reviewed. No pertinent family history. Social History  Substance  Use Topics  . Smoking status: Never Smoker  . Smokeless tobacco: Never Used  . Alcohol use No   OB History    No data available     Review of Systems  Constitutional: Positive for chills. Negative for fever.  HENT: Positive for congestion, ear pain and sinus pain. Negative for ear discharge and sore throat.   Respiratory: Positive for shortness of breath. Negative for cough.   Cardiovascular: Positive for chest pain. Negative for palpitations.  Gastrointestinal: Positive for abdominal pain. Negative for constipation, diarrhea, nausea and vomiting.  Neurological: Positive for dizziness.    Allergies  Codeine; Levaquin [levofloxacin in d5w]; Lodine [etodolac]; Nsaids; Tetracyclines & related; and Sulfa antibiotics  Home Medications   Prior to Admission medications   Medication Sig Start Date End Date Taking? Authorizing Provider  Ascorbic Acid (VITAMIN C PO) Take 1 tablet by mouth daily.   Yes [provider]  Cholecalciferol (VITAMIN D-3 PO) Take 1 tablet by mouth daily.   Yes [provider]  GARLIC PO Take 1 tablet by mouth daily.   Yes [provider]  loratadine (CLARITIN) 10 MG tablet Take 10 mg by mouth daily as needed for allergies.   Yes [provider]  naproxen sodium (ANAPROX) 220 MG tablet Take 220 mg by mouth daily as needed (for pain).   Yes [provider]  ranitidine (ZANTAC) 150 MG tablet Take 300 mg by mouth daily.   Yes [provider]   Meds Ordered and Administered  this Visit  Medications - No data to display  BP 105/63 (BP Location: Left Arm)   Pulse 70   Temp 98.3 F (36.8 C) (Oral)   Resp 16   Ht 5\' 5"  (1.651 m)   Wt 145 lb (65.8 kg)   SpO2 97%   BMI 24.13 kg/m  No data found.   Physical Exam  Constitutional: Vital signs are normal. She appears well-developed and well-nourished.  HENT:  Head: Normocephalic and atraumatic.  Right Ear: Hearing, tympanic membrane, external ear and ear canal  normal. No drainage, swelling or tenderness. No foreign bodies. Tympanic membrane is not erythematous and not bulging. No middle ear effusion. No decreased hearing is noted.  Left Ear: Hearing, tympanic membrane, external ear and ear canal normal. No drainage, swelling or tenderness. No foreign bodies. Tympanic membrane is not erythematous and not bulging.  No middle ear effusion. No decreased hearing is noted.  Nose: Nose normal. No rhinorrhea. Right sinus exhibits no maxillary sinus tenderness and no frontal sinus tenderness. Left sinus exhibits no maxillary sinus tenderness and no frontal sinus tenderness.  Mouth/Throat: Uvula is midline, oropharynx is clear and moist and mucous membranes are normal. No uvula swelling. No oropharyngeal exudate, posterior oropharyngeal edema, posterior oropharyngeal erythema or tonsillar abscesses.  Eyes: Pupils are equal, round, and reactive to light. Conjunctivae, EOM and lids are normal. Lids are everted and swept, no foreign bodies found.  Cardiovascular: Normal rate, regular rhythm, normal heart sounds and normal pulses.   Pulmonary/Chest: Effort normal and breath sounds normal. She has no wheezes. She has no rhonchi. She has no rales.  Abdominal: Soft. Normal appearance and bowel sounds are normal. She exhibits no distension, no fluid wave, no ascites and no mass. There is no tenderness. There is no rigidity, no rebound, no guarding and no CVA tenderness.  Lymphadenopathy:       Head (right side): No submental, no submandibular, no tonsillar, no preauricular, no posterior auricular and no occipital adenopathy present.       Head (left side): No submental, no submandibular, no tonsillar, no preauricular, no posterior auricular and no occipital adenopathy present.    She has no cervical adenopathy.       Right cervical: No superficial cervical, no deep cervical and no posterior cervical adenopathy present.      Left cervical: No superficial cervical, no deep  cervical and no posterior cervical adenopathy present.  Neurological: She is alert. She has normal strength. No cranial nerve deficit or sensory deficit. She displays a negative Romberg sign.  Reflex Scores:      Bicep reflexes are 2+ on the right side and 2+ on the left side.      Patellar reflexes are 2+ on the right side and 2+ on the left side. Grip equal and strong bilateral upper extremities. Gait strong and steady. Able to perform finger-to-nose without difficulty.    Skin: Skin is warm and dry.  Psychiatric: She has a normal mood and affect. Her speech is normal and behavior is normal. Thought content normal.  Vitals reviewed.   Urgent Care Course     Procedures (including critical care time)  Labs Review Labs Reviewed  URINALYSIS, COMPLETE (UACMP) WITH MICROSCOPIC - Abnormal; Notable for the following:       Result Value   Hgb urine dipstick TRACE (*)    Leukocytes, UA SMALL (*)    Squamous Epithelial / LPF 0-5 (*)    All other components within normal limits    Imaging  Review No results found.     MDM   1. Left ear pain    A pleasant patient seen today which appeared to be quite anxious with multiple complaints. She was in no acute respiratory distress and was nonlabored her speech. She is not diaphoretic, pale and quite pleasant, laughing in exam room. Reassured by benign physical exam.   Chief complaint initially left ear pain however then patient had complaints about chest pain, dizziness, and severe abdominal pain for the past couple days. Long discussion about multitude, severity of patient's complaints, we jointly agreed that I would be more comfortable with her being evaluated by a higher level of care. Patient was very agreeable to this plan and agreed to go to [facility] emergency room as her preferential ED. Advised her to drive directly thereand not eat or drink anything.    Allegra Granarnett, Evone Arseneau G, FNP 09/05/16 1200

## 2016-09-05 NOTE — ED Triage Notes (Signed)
61 year old Caucasian female is here today with complaints of left ear pain, temporal pain, sinus pressure and abdomen pain that started yesterday.

## 2016-09-07 LAB — URINE CULTURE: Culture: NO GROWTH

## 2016-09-10 ENCOUNTER — Ambulatory Visit: Payer: Self-pay | Admitting: Nurse Practitioner

## 2016-09-18 ENCOUNTER — Ambulatory Visit: Payer: Self-pay | Admitting: Family Medicine
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
## 2018-07-07 DIAGNOSIS — M9901 Segmental and somatic dysfunction of cervical region: Secondary | ICD-10-CM | POA: Diagnosis not present

## 2018-07-07 DIAGNOSIS — M5031 Other cervical disc degeneration,  high cervical region: Secondary | ICD-10-CM | POA: Diagnosis not present

## 2018-07-07 DIAGNOSIS — M9903 Segmental and somatic dysfunction of lumbar region: Secondary | ICD-10-CM | POA: Diagnosis not present

## 2018-07-07 DIAGNOSIS — M5136 Other intervertebral disc degeneration, lumbar region: Secondary | ICD-10-CM | POA: Diagnosis not present

## 2018-07-12 DIAGNOSIS — M9903 Segmental and somatic dysfunction of lumbar region: Secondary | ICD-10-CM | POA: Diagnosis not present

## 2018-07-12 DIAGNOSIS — M5136 Other intervertebral disc degeneration, lumbar region: Secondary | ICD-10-CM | POA: Diagnosis not present

## 2018-07-12 DIAGNOSIS — M9901 Segmental and somatic dysfunction of cervical region: Secondary | ICD-10-CM | POA: Diagnosis not present

## 2018-07-12 DIAGNOSIS — M5033 Other cervical disc degeneration, cervicothoracic region: Secondary | ICD-10-CM | POA: Diagnosis not present

## 2018-07-26 DIAGNOSIS — M5136 Other intervertebral disc degeneration, lumbar region: Secondary | ICD-10-CM | POA: Diagnosis not present

## 2018-07-26 DIAGNOSIS — M9901 Segmental and somatic dysfunction of cervical region: Secondary | ICD-10-CM | POA: Diagnosis not present

## 2018-07-26 DIAGNOSIS — M530 Cervicocranial syndrome: Secondary | ICD-10-CM | POA: Diagnosis not present

## 2018-07-26 DIAGNOSIS — M9903 Segmental and somatic dysfunction of lumbar region: Secondary | ICD-10-CM | POA: Diagnosis not present

## 2018-07-26 DIAGNOSIS — M5134 Other intervertebral disc degeneration, thoracic region: Secondary | ICD-10-CM | POA: Diagnosis not present

## 2018-07-26 DIAGNOSIS — M9902 Segmental and somatic dysfunction of thoracic region: Secondary | ICD-10-CM | POA: Diagnosis not present

## 2018-07-28 DIAGNOSIS — M9903 Segmental and somatic dysfunction of lumbar region: Secondary | ICD-10-CM | POA: Diagnosis not present

## 2018-07-28 DIAGNOSIS — M9902 Segmental and somatic dysfunction of thoracic region: Secondary | ICD-10-CM | POA: Diagnosis not present

## 2018-07-28 DIAGNOSIS — M9901 Segmental and somatic dysfunction of cervical region: Secondary | ICD-10-CM | POA: Diagnosis not present

## 2018-07-28 DIAGNOSIS — M5136 Other intervertebral disc degeneration, lumbar region: Secondary | ICD-10-CM | POA: Diagnosis not present

## 2018-07-28 DIAGNOSIS — M530 Cervicocranial syndrome: Secondary | ICD-10-CM | POA: Diagnosis not present

## 2018-07-28 DIAGNOSIS — M5134 Other intervertebral disc degeneration, thoracic region: Secondary | ICD-10-CM | POA: Diagnosis not present

## 2018-08-02 DIAGNOSIS — H04123 Dry eye syndrome of bilateral lacrimal glands: Secondary | ICD-10-CM | POA: Diagnosis not present

## 2018-08-12 DIAGNOSIS — J40 Bronchitis, not specified as acute or chronic: Secondary | ICD-10-CM | POA: Diagnosis not present

## 2018-08-12 DIAGNOSIS — J301 Allergic rhinitis due to pollen: Secondary | ICD-10-CM | POA: Diagnosis not present

## 2018-08-20 DIAGNOSIS — M5134 Other intervertebral disc degeneration, thoracic region: Secondary | ICD-10-CM | POA: Diagnosis not present

## 2018-08-20 DIAGNOSIS — M9903 Segmental and somatic dysfunction of lumbar region: Secondary | ICD-10-CM | POA: Diagnosis not present

## 2018-08-20 DIAGNOSIS — M5136 Other intervertebral disc degeneration, lumbar region: Secondary | ICD-10-CM | POA: Diagnosis not present

## 2018-08-20 DIAGNOSIS — M9901 Segmental and somatic dysfunction of cervical region: Secondary | ICD-10-CM | POA: Diagnosis not present

## 2018-08-20 DIAGNOSIS — M9902 Segmental and somatic dysfunction of thoracic region: Secondary | ICD-10-CM | POA: Diagnosis not present

## 2018-08-20 DIAGNOSIS — M531 Cervicobrachial syndrome: Secondary | ICD-10-CM | POA: Diagnosis not present

## 2018-08-24 ENCOUNTER — Other Ambulatory Visit: Payer: Self-pay | Admitting: Family Medicine

## 2018-08-24 ENCOUNTER — Ambulatory Visit
Admission: RE | Admit: 2018-08-24 | Discharge: 2018-08-24 | Disposition: A | Discharge status: Home / Self Care | Payer: Medicare HMO | Source: Ambulatory Visit | Attending: Family Medicine | Admitting: Family Medicine

## 2018-08-24 DIAGNOSIS — R05 Cough: Secondary | ICD-10-CM

## 2018-08-24 DIAGNOSIS — R059 Cough, unspecified: Secondary | ICD-10-CM

## 2019-03-04 DIAGNOSIS — R0789 Other chest pain: Secondary | ICD-10-CM | POA: Diagnosis not present

## 2019-03-04 DIAGNOSIS — R0602 Shortness of breath: Secondary | ICD-10-CM | POA: Diagnosis not present

## 2019-03-07 DIAGNOSIS — R0789 Other chest pain: Secondary | ICD-10-CM | POA: Diagnosis not present

## 2019-03-07 DIAGNOSIS — R69 Illness, unspecified: Secondary | ICD-10-CM | POA: Diagnosis not present

## 2019-03-14 DIAGNOSIS — I6523 Occlusion and stenosis of bilateral carotid arteries: Secondary | ICD-10-CM | POA: Diagnosis not present

## 2019-04-12 ENCOUNTER — Other Ambulatory Visit: Payer: Self-pay

## 2019-04-12 ENCOUNTER — Telehealth: Payer: Self-pay | Admitting: Neurology

## 2019-04-12 ENCOUNTER — Encounter: Payer: Self-pay | Admitting: Neurology

## 2019-04-12 ENCOUNTER — Ambulatory Visit: Payer: Medicare HMO | Admitting: Neurology

## 2019-04-12 VITALS — BP 113/69 | HR 80 | Ht 65.0 in | Wt 137.0 lb

## 2019-04-12 DIAGNOSIS — G4489 Other headache syndrome: Secondary | ICD-10-CM | POA: Diagnosis not present

## 2019-04-12 DIAGNOSIS — Z5181 Encounter for therapeutic drug level monitoring: Secondary | ICD-10-CM

## 2019-04-12 MED ORDER — NORTRIPTYLINE HCL 10 MG PO CAPS
ORAL_CAPSULE | ORAL | 3 refills | Status: DC
Start: 2019-04-12 — End: 2019-04-28

## 2019-04-12 NOTE — Progress Notes (Signed)
Reason for visit: Daily headaches  Referring physician: Dr. [name] is a 64 y.o. female  History of present illness:  Teresa Mcdaniel is a 64 year old right-handed white female with a history of headaches that have been daily in nature of the last 1-1/2 years.  Since 03 March 2019, the headaches have become more severe.  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  On 21 January she also began having some chest pain and she will be seeing a cardiologist in the near future.  The patient indicates that her headaches usually begin on the top of the head with a pressure sensation and then may involve the back of the head on the left occipital area and left temporal region primarily.  She may have some neck discomfort as well.  She clearly has an association with the severity of the headache and the severity of stress.  She reports some nausea without vomiting, she may have occasional dizziness and feel off balance.  She feels weak all over at times.  She may have some blurring of vision with the headache.  She will take Advil if needed.  She denies a family history of headache.  The patient generally sleeps fairly well at night, and occasionally the headaches will wake her up from sleep.  She has some fatigue with physical activity during the day.  She is sent to this office for an evaluation.  She does have a history of a right carotid bruit, she is followed through the [facility] system for some right carotid stenosis, she has occlusion of the right vertebral artery.  The patient is followed on a regular basis with carotid Doppler studies.  The most recent carotid Doppler study shows 40 to 59% stenosis of the right internal carotid artery with less than 40% stenosis of the left internal carotid artery, the right vertebral artery was not visualized.  Past Medical History:  Diagnosis Date  . Fibromyalgia   . Gastritis  before 2000  . Hyperlipidemia   . Interstitial cystitis   . Mitral valve prolapse    Dx at time of pericarditis  . Pericarditis 1990 or before    Past Surgical History:  Procedure Laterality Date  . ABDOMINAL HYSTERECTOMY    . CARDIAC CATHETERIZATION  Before 1990   Clean per patient    History reviewed. No pertinent family history.  Social history:  reports that she has never smoked. She has never used smokeless tobacco. She reports that she does not drink alcohol or use drugs.  Medications:  Prior to Admission medications   Medication Sig Start Date End Date Taking? Authorizing Provider  Ascorbic Acid (VITAMIN C PO) Take 1 tablet by mouth daily.    [provider]  Cholecalciferol (VITAMIN D-3 PO) Take 1 tablet by mouth daily.    [provider]  GARLIC PO Take 1 tablet by mouth daily.    [provider]  loratadine (CLARITIN) 10 MG tablet Take 10 mg by mouth daily as needed for allergies.    [provider]  naproxen sodium (ANAPROX) 220 MG tablet Take 220 mg by mouth daily as needed (for pain).    [provider]  ranitidine (ZANTAC) 150 MG tablet Take 300 mg by mouth daily.    [provider]      Allergies  Allergen Reactions  . Codeine Nausea Only  . Levaquin [Levofloxacin In D5w]   .  Lodine [Etodolac]   . Nsaids Other (See Comments)    Stomach problems  . Tetracyclines & Related Swelling  . Sulfa Antibiotics Swelling    ROS:  Out of a complete 14 system review of symptoms, the patient complains only of the following symptoms, and all other reviewed systems are negative.  Headache Chest pain Cold intolerance  Blood pressure 113/69, pulse 80, height 5\' 5"  (1.651 m), weight 137 lb (62.1 kg).  Physical Exam  General: The patient is alert and cooperative at the time of the examination.  Eyes: Pupils are equal, round, and reactive to light. Discs are flat bilaterally.  Neck: The neck is supple, a  high-pitched right carotid bruit is noted, no bruit is noted on the left.  Respiratory: The respiratory examination is clear.  Cardiovascular: The cardiovascular examination reveals a regular rate and rhythm, no obvious murmurs or rubs are noted.  Neuromuscular: The patient has fairly good range move the cervical spine, no crepitus is noted in the temporomandibular joints.  The patient has no tenderness over the temporal arteries on either side.  Skin: Extremities are without significant edema.  Neurologic Exam  Mental status: The patient is alert and oriented x 3 at the time of the examination. The patient has apparent normal recent and remote memory, with an apparently normal attention span and concentration ability.  Cranial nerves: Facial symmetry is present. There is good sensation of the face to pinprick and soft touch bilaterally. The strength of the facial muscles and the muscles to head turning and shoulder shrug are normal bilaterally. Speech is well enunciated, no aphasia or dysarthria is noted. Extraocular movements are full. Visual fields are full. The tongue is midline, and the patient has symmetric elevation of the soft palate. No obvious hearing deficits are noted.  Motor: The motor testing reveals 5 over 5 strength of all 4 extremities. Good symmetric motor tone is noted throughout.  Sensory: Sensory testing is intact to pinprick, soft touch, vibration sensation, and position sense on all 4 extremities. No evidence of extinction is noted.  Coordination: Cerebellar testing reveals good finger-nose-finger and heel-to-shin bilaterally.  Gait and station: Gait is normal. Tandem gait is minimally unsteady. Romberg is negative. No drift is seen.  Reflexes: Deep tendon reflexes are symmetric and normal bilaterally. Toes are downgoing bilaterally.   Assessment/Plan:  1.  Chronic daily headache  The patient has reported some increased headache associated with stress taking care  of her mother.  The patient may have some component of a tension type headache.  The patient indicates that she does not tolerate medications well in general.  We will start low-dose nortriptyline working up to 20 mg at night.  She will call if she requires any dose adjustments.  The patient will be set up for blood work today, she will have MRI of the brain, she will be seen back in 2 to 3 months.  MD 04/12/2019 10:09 AM  [facility] [address] [place], [place]  Phone [phone] Fax [phone]

## 2019-04-12 NOTE — Telephone Encounter (Signed)
[employer] medicare order sent to [facility] they will obtain the auth and reach out to the patient to schedule.

## 2019-04-12 NOTE — Patient Instructions (Signed)
We will start nortriptyline for the headache.   Pamelor (nortriptyline) is an antidepressant medication that has many uses that may include headache, whiplash injuries, or for peripheral neuropathy pain. Side effects may include drowsiness, dry mouth, blurred vision, or constipation. As with any antidepressant medication, worsening depression may occur. If you had any significant side effects, please call our office. The full effects of this medication may take 7-10 days after starting the drug, or going up on the dose.  

## 2019-04-13 LAB — COMPREHENSIVE METABOLIC PANEL
ALT: 13 IU/L (ref 0–32)
AST: 16 IU/L (ref 0–40)
Albumin/Globulin Ratio: 1.8 (ref 1.2–2.2)
Albumin: 4.4 g/dL (ref 3.8–4.8)
Alkaline Phosphatase: 87 IU/L (ref 39–117)
BUN/Creatinine Ratio: 20 (ref 12–28)
BUN: 17 mg/dL (ref 8–27)
Bilirubin Total: 0.2 mg/dL (ref 0.0–1.2)
CO2: 25 mmol/L (ref 20–29)
Calcium: 9.7 mg/dL (ref 8.7–10.3)
Chloride: 104 mmol/L (ref 96–106)
Creatinine, Ser: 0.83 mg/dL (ref 0.57–1.00)
GFR calc Af Amer: 85 mL/min/{1.73_m2} (ref >59)
GFR calc non Af Amer: 74 mL/min/{1.73_m2} (ref >59)
Globulin, Total: 2.4 g/dL (ref 1.5–4.5)
Glucose: 87 mg/dL (ref 65–99)
Potassium: 4.3 mmol/L (ref 3.5–5.2)
Sodium: 143 mmol/L (ref 134–144)
Total Protein: 6.8 g/dL (ref 6.0–8.5)

## 2019-04-13 LAB — SEDIMENTATION RATE: Sed Rate: 7 mm/hr (ref 0–40)

## 2019-04-18 DIAGNOSIS — M9901 Segmental and somatic dysfunction of cervical region: Secondary | ICD-10-CM | POA: Diagnosis not present

## 2019-04-18 DIAGNOSIS — M9903 Segmental and somatic dysfunction of lumbar region: Secondary | ICD-10-CM | POA: Diagnosis not present

## 2019-04-18 DIAGNOSIS — M546 Pain in thoracic spine: Secondary | ICD-10-CM | POA: Diagnosis not present

## 2019-04-18 DIAGNOSIS — M9902 Segmental and somatic dysfunction of thoracic region: Secondary | ICD-10-CM | POA: Diagnosis not present

## 2019-04-18 DIAGNOSIS — M545 Low back pain: Secondary | ICD-10-CM | POA: Diagnosis not present

## 2019-04-18 DIAGNOSIS — M542 Cervicalgia: Secondary | ICD-10-CM | POA: Diagnosis not present

## 2019-04-22 DIAGNOSIS — M546 Pain in thoracic spine: Secondary | ICD-10-CM | POA: Diagnosis not present

## 2019-04-22 DIAGNOSIS — M9902 Segmental and somatic dysfunction of thoracic region: Secondary | ICD-10-CM | POA: Diagnosis not present

## 2019-04-22 DIAGNOSIS — M9901 Segmental and somatic dysfunction of cervical region: Secondary | ICD-10-CM | POA: Diagnosis not present

## 2019-04-22 DIAGNOSIS — M545 Low back pain: Secondary | ICD-10-CM | POA: Diagnosis not present

## 2019-04-22 DIAGNOSIS — M542 Cervicalgia: Secondary | ICD-10-CM | POA: Diagnosis not present

## 2019-04-22 DIAGNOSIS — M9903 Segmental and somatic dysfunction of lumbar region: Secondary | ICD-10-CM | POA: Diagnosis not present

## 2019-04-27 NOTE — Progress Notes (Signed)
Cardiology Office Note:    Date:  04/28/2019   ID:  Teresa Mcdaniel, DOB 26-May-1955, MRN [id number]  PCP:  Teresa Frees, MD  Cardiologist:  No primary care provider on file.   Referring MD: Teresa Frees, MD   Chief Complaint  Patient presents with  . Chest Pain  . Advice Only    Carotid disease    History of Present Illness:    Teresa Mcdaniel is a 64 y.o. female with a hx of mitral valve prolapse, poorly controlled BP and recent chest pain. .  The patient has a history of mitral valve prolapse but no echocardiographic evidence of prolapse by a relatively recent echo.  She also has a remote history, greater than 15 years ago, of pericarditis that was treated with anti-inflammatory therapy.  This diagnosis was made by Dr. Chase Picket.  She has had subsequent recent evaluations with nuclear scintigraphy and echocardiography (2019) that did not demonstrate significant abnormality.  The reason for today's visit is 4 weeks ago she developed chest tightness and discomfort for 4 continuous days.  She described it as a tightness in the chest.  Physical activity and certain positions worsen the intensity of the discomfort.  There was also some shortness of breath, not splinting.  She is under stress because she provides all care for her 47 year old mother.  The discomfort felt somewhat like when she had with pericarditis.  Pericarditis 15 years ago was treated with nonsteroidals.  Meloxicam was used most recently, but she feels it did not do much good (however pain went away after 4 days).  In speaking with the patient it became clear that she also has bilateral carotid disease followed at [facility].  Past Medical History:  Diagnosis Date  . Allergic rhinitis   . Anxiety   . Fibromyalgia   . Gastritis before 2000  . Hyperlipidemia   . Interstitial cystitis   . Mitral valve prolapse    Dx at time of pericarditis  . Pericarditis 1990 or before  . Rheumatism   . Stenosis of right  carotid artery     Past Surgical History:  Procedure Laterality Date  . ABDOMINAL HYSTERECTOMY    . CARDIAC CATHETERIZATION  Before 1990   Clean per patient    Current Medications: Current Meds  Medication Sig  . Ascorbic Acid (VITAMIN C PO) Take 1 tablet by mouth daily.  . Cholecalciferol (VITAMIN D-3 PO) Take 1 tablet by mouth daily.  . famotidine (PEPCID) 20 MG tablet Take 20 mg by mouth daily.  . fluticasone (FLONASE) 50 MCG/ACT nasal spray Place 2 sprays into both nostrils daily.  Marland Kitchen GARLIC PO Take 1 tablet by mouth daily.  Marland Kitchen loratadine (CLARITIN) 10 MG tablet Take 10 mg by mouth daily as needed for allergies.  Marland Kitchen LORazepam (ATIVAN) 0.5 MG tablet Take 0.5 mg by mouth daily as needed.  . naproxen sodium (ANAPROX) 220 MG tablet Take 220 mg by mouth daily as needed (for pain).  . ranitidine (ZANTAC) 150 MG tablet Take 300 mg by mouth daily.     Allergies:   Codeine, Levaquin [levofloxacin in d5w], Lodine [etodolac], Nsaids, Tetracyclines & related, and Sulfa antibiotics   Social History   Socioeconomic History  . Marital status: Divorced    Spouse name: Not on file  . Number of children: Not on file  . Years of education: Not on file  . Highest education level: Not on file  Occupational History  . Occupation: Works for [employer]  Tobacco Use  .  Smoking status: Never Smoker  . Smokeless tobacco: Never Used  Substance and Sexual Activity  . Alcohol use: No  . Drug use: No  . Sexual activity: Not on file  Other Topics Concern  . Not on file  Social History Narrative    Takes care of her mother who lives next door.   Social Determinants of Health   Financial Resource Strain:   . Difficulty of Paying Living Expenses:   Food Insecurity:   . Worried About Charity fundraiser in the Last Year:   . Arboriculturist in the Last Year:   Transportation Needs:   . Film/video editor (Medical):   Marland Kitchen Lack of Transportation (Non-Medical):   Physical Activity:     . Days of Exercise per Week:   . Minutes of Exercise per Session:   Stress:   . Feeling of Stress :   Social Connections:   . Frequency of Communication with Friends and Family:   . Frequency of Social Gatherings with Friends and Family:   . Attends Religious Services:   . Active Member of Clubs or Organizations:   . Attends Archivist Meetings:   Marland Kitchen Marital Status:      Family History: The patient's family history is not on file.  ROS:   Please see the history of present illness.    Carotid disease is noted.  Stress related to caring for her mother.  Difficulty with GERD.  Thinks that she has mitral valve prolapse.  She has hyperostosis crania.  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  All other systems reviewed and are negative.  EKGs/Labs/Other Studies Reviewed:    The following studies were reviewed today:  Normal nuclear 2014  Echocardiogram 2014 with mild MR but no MVP.  Echocardiogram 2019: Result Narrative   Normal left ventricular systolic function, ejection fraction > 55%  Aortic sclerosis  Normal right ventricular systolic function     1245 Carotid Doppler: Final Interpretation Right Carotid: There is evidence in the ICA of at least a 40-59 % stenosis.                There is a 30% chance that the stenosis could be greater than                60%. Non-hemodynamically significant plaque noted in the CCA. Left Carotid: There is evidence in the ICA of a less than 40% stenosis.               Non-hemodynamically significant plaque noted in the CCA. Vertebrals:  Right vertebral artery was not visualized. Subclavians: Normal flow hemodynamics were seen in bilateral subclavian              arteries.  EKG:  EKG sinus rhythm with nonspecific ST depression, diffuse.  Recent Labs: 04/12/2019: ALT 13; BUN 17; Creatinine, Ser 0.83; Potassium 4.3; Sodium 143  Recent Lipid Panel    Component Value Date/Time   CHOL 326  (H) 06/01/2013 1442   TRIG 170 (H) 06/01/2013 1442   HDL 48 06/01/2013 1442   CHOLHDL 6.8 06/01/2013 1442   VLDL 34 06/01/2013 1442   LDLCALC 244 (H) 06/01/2013 1442    Physical Exam:    VS:  BP 122/62   Pulse 74   Ht 5' 5"  (1.651 m)   Wt 147 lb (66.7 kg)   SpO2 98%   BMI 24.46 kg/m     Wt Readings  from Last 3 Encounters:  04/28/19 147 lb (66.7 kg)  04/12/19 137 lb (62.1 kg)  09/05/16 145 lb (65.8 kg)     GEN: Healthy-appearing without obesity. No acute distress HEENT: Normal NECK: High-pitched right carotid bruit LYMPHATICS: No lymphadenopathy CARDIAC:  RRR without murmur, gallop, or edema. VASCULAR:  Normal Pulses. No bruits. RESPIRATORY:  Clear to auscultation without rales, wheezing or rhonchi  ABDOMEN: Soft, non-tender, non-distended, No pulsatile mass, MUSCULOSKELETAL: No deformity  SKIN: Warm and dry NEUROLOGIC:  Alert and oriented x 3 PSYCHIATRIC:  Normal affect   ASSESSMENT:    1. Precordial pain   2. Mitral valve prolapse   3. Hyperlipidemia LDL goal <70   4. Bilateral carotid artery stenosis   5. History of pericarditis   6. Chest pain radiating to jaw   7. Educated about COVID-19 virus infection    PLAN:    In order of problems listed above:  1. Etiology is uncertain.  More likely related to inflammatory pain/recurrent pericarditis and CAD.  However, the patient has hyperlipidemia, family history of CAD, and bilateral carotid disease.  We will perform inflammatory markers (ESR and high-sensitivity CRP).  She will also have a coronary CT with FFR if indicated to exclude significant underlying obstructive disease. 2. She does not have mitral valve prolapse clinically had the echocardiogram done in 2019 did not demonstrate structural abnormalities consistent with prolapse.  We discussed this diagnosis and the decrease in frequency of diagnosis since strict diagnostic criteria were put in place greater than 20 years ago. 3. LDL target should be less than  70 4. Bilateral carotid disease is being followed at [facility].  She has a significant right carotid bruit that is high-pitched. 5. Could be having recurrent pericarditis.  We will see whether the inflammatory markers suggest.  She is currently asymptomatic.  Overall education and awareness concerning primary/secondary risk prevention was discussed in detail: LDL less than 70, hemoglobin A1c less than 7, blood pressure target less than 130/80 mmHg, >150 minutes of moderate aerobic activity per week, avoidance of smoking, weight control (via diet and exercise), and continued surveillance/management of/for obstructive sleep apnea.    Medication Adjustments/Labs and Tests Ordered: Current medicines are reviewed at length with the patient today.  Concerns regarding medicines are outlined above.  Orders Placed This Encounter  Procedures  . CT CORONARY MORPH W/CTA COR W/SCORE W/CA W/CM &/OR WO/CM  . CT CORONARY FRACTIONAL FLOW RESERVE DATA PREP  . CT CORONARY FRACTIONAL FLOW RESERVE FLUID ANALYSIS  . Lipid panel  . Sedimentation rate  . CRP High sensitivity  . Basic metabolic panel  . EKG 12-Lead   Meds ordered this encounter  Medications  . metoprolol tartrate (LOPRESSOR) 100 MG tablet    Sig: Take one tablet my mouth 2 hours prior to your CT.    Dispense:  1 tablet    Refill:  0    Patient Instructions  Medication Instructions:  Your physician recommends that you continue on your current medications as directed. Please refer to the Current Medication list given to you today.  *If you need a refill on your cardiac medications before your next appointment, please call your pharmacy*   Lab Work: Lipid, ESR and HS CRP today  If you have labs (blood work) drawn today and your tests are completely normal, you will receive your results only by: Marland Kitchen MyChart Message (if you have MyChart) OR . A paper copy in the mail If you have any lab test that is  abnormal or we need to change your  treatment, we will call you to review the results.   Testing/Procedures: Your physician recommends that you have a Coronary CT performed.    Follow-Up: At [facility], you and your health needs are our priority.  As part of our continuing mission to provide you with exceptional heart care, we have created designated Provider Care Teams.  These Care Teams include your primary Cardiologist (physician) and Advanced Practice Providers (APPs -  Physician Assistants and Nurse Practitioners) who all work together to provide you with the care you need, when you need it.  We recommend signing up for the patient portal called "MyChart".  Sign up information is provided on this After Visit Summary.  MyChart is used to connect with patients for Virtual Visits (Telemedicine).  Patients are able to view lab/test results, encounter notes, upcoming appointments, etc.  Non-urgent messages can be sent to your provider as well.   To learn more about what you can do with MyChart, go to [email or web address].    Your next appointment:   12 month(s)  The format for your next appointment:   In Person  Provider:   You may see Dr. Daneen Schick or one of the following Advanced Practice Providers on your designated Care Team:    Truitt Merle, NP  Cecilie Kicks, NP  Kathyrn Drown, NP    Other Instructions  Your cardiac CT will be scheduled at one of the below locations:   [facility] [address] [place], [place] [address] [phone]  [place] [address] [place], [place] [address] [phone]  If scheduled at [facility], please arrive at the [facility] main entrance of [facility] 30 minutes prior to test start time. Proceed to the [facility] Radiology Department (first floor) to check-in and test prep.  If scheduled at [facility], please arrive 15 mins early for check-in  and test prep.  Please follow these instructions carefully (unless otherwise directed):  Hold all erectile dysfunction medications at least 3 days (72 hrs) prior to test.  On the Night Before the Test: . Be sure to Drink plenty of water. . Do not consume any caffeinated/decaffeinated beverages or chocolate 12 hours prior to your test. . Do not take any antihistamines 12 hours prior to your test.   On the Day of the Test: . Drink plenty of water. Do not drink any water within one hour of the test. . Do not eat any food 4 hours prior to the test. . You may take your regular medications prior to the test.  . Take metoprolol (Lopressor) two hours prior to test. . FEMALES- please wear underwire-free bra if available        After the Test: . Drink plenty of water. . After receiving IV contrast, you may experience a mild flushed feeling. This is normal. . On occasion, you may experience a mild rash up to 24 hours after the test. This is not dangerous. If this occurs, you can take Benadryl 25 mg and increase your fluid intake. . If you experience trouble breathing, this can be serious. If it is severe call 911 IMMEDIATELY. If it is mild, please call our office. . If you take any of these medications: Glipizide/Metformin, Avandament, Glucavance, please do not take 48 hours after completing test unless otherwise instructed.   Once we have confirmed authorization from your insurance company, we will call  you to set up a date and time for your test.   For non-scheduling related questions, please contact the cardiac imaging nurse navigator should you have any questions/concerns: Marchia Bond, RN Navigator Cardiac Imaging [facility] [phone] office  For scheduling needs, including cancellations and rescheduling, please call [phone].        Signed, Sinclair Grooms, MD  04/28/2019 12:03 PM    [place]

## 2019-04-28 ENCOUNTER — Ambulatory Visit: Payer: Medicare HMO | Admitting: Interventional Cardiology

## 2019-04-28 ENCOUNTER — Encounter: Payer: Self-pay | Admitting: Interventional Cardiology

## 2019-04-28 ENCOUNTER — Other Ambulatory Visit: Payer: Self-pay

## 2019-04-28 VITALS — BP 122/62 | HR 74 | Ht 65.0 in | Wt 147.0 lb

## 2019-04-28 DIAGNOSIS — Z7189 Other specified counseling: Secondary | ICD-10-CM

## 2019-04-28 DIAGNOSIS — E785 Hyperlipidemia, unspecified: Secondary | ICD-10-CM

## 2019-04-28 DIAGNOSIS — I341 Nonrheumatic mitral (valve) prolapse: Secondary | ICD-10-CM | POA: Diagnosis not present

## 2019-04-28 DIAGNOSIS — I6523 Occlusion and stenosis of bilateral carotid arteries: Secondary | ICD-10-CM | POA: Diagnosis not present

## 2019-04-28 DIAGNOSIS — R079 Chest pain, unspecified: Secondary | ICD-10-CM

## 2019-04-28 DIAGNOSIS — Z8679 Personal history of other diseases of the circulatory system: Secondary | ICD-10-CM

## 2019-04-28 DIAGNOSIS — R072 Precordial pain: Secondary | ICD-10-CM | POA: Diagnosis not present

## 2019-04-28 MED ORDER — METOPROLOL TARTRATE 100 MG PO TABS: ORAL_TABLET | ORAL | 0 refills | Status: DC

## 2019-04-28 NOTE — Patient Instructions (Addendum)
Medication Instructions:  Your physician recommends that you continue on your current medications as directed. Please refer to the Current Medication list given to you today.  *If you need a refill on your cardiac medications before your next appointment, please call your pharmacy*   Lab Work: Lipid, ESR and HS CRP today  If you have labs (blood work) drawn today and your tests are completely normal, you will receive your results only by: Marland Kitchen MyChart Message (if you have MyChart) OR . A paper copy in the mail If you have any lab test that is abnormal or we need to change your treatment, we will call you to review the results.   Testing/Procedures: Your physician recommends that you have a Coronary CT performed.    Follow-Up: At [facility], you and your health needs are our priority.  As part of our continuing mission to provide you with exceptional heart care, we have created designated Provider Care Teams.  These Care Teams include your primary Cardiologist (physician) and Advanced Practice Providers (APPs -  Physician Assistants and Nurse Practitioners) who all work together to provide you with the care you need, when you need it.  We recommend signing up for the patient portal called "MyChart".  Sign up information is provided on this After Visit Summary.  MyChart is used to connect with patients for Virtual Visits (Telemedicine).  Patients are able to view lab/test results, encounter notes, upcoming appointments, etc.  Non-urgent messages can be sent to your provider as well.   To learn more about what you can do with MyChart, go to [email or web address].    Your next appointment:   12 month(s)  The format for your next appointment:   In Person  Provider:   You may see Dr. Daneen Schick or one of the following Advanced Practice Providers on your designated Care Team:    Truitt Merle, NP  Cecilie Kicks, NP  Kathyrn Drown, NP    Other Instructions  Your cardiac CT will  be scheduled at one of the below locations:   [facility] [address] [place], [place] [address] [phone]  [place] [address] [place], [place] [address] [phone]  If scheduled at [facility], please arrive at the [facility] main entrance of [facility] 30 minutes prior to test start time. Proceed to the [facility] Radiology Department (first floor) to check-in and test prep.  If scheduled at [facility], please arrive 15 mins early for check-in and test prep.  Please follow these instructions carefully (unless otherwise directed):  Hold all erectile dysfunction medications at least 3 days (72 hrs) prior to test.  On the Night Before the Test: . Be sure to Drink plenty of water. . Do not consume any caffeinated/decaffeinated beverages or chocolate 12 hours prior to your test. . Do not take any antihistamines 12 hours prior to your test.   On the Day of the Test: . Drink plenty of water. Do not drink any water within one hour of the test. . Do not eat any food 4 hours prior to the test. . You may take your regular medications prior to the test.  . Take metoprolol (Lopressor) two hours prior to test. . FEMALES- please wear underwire-free bra if available        After the Test: . Drink plenty of water. . After receiving IV contrast, you may experience a mild flushed feeling. This  is normal. . On occasion, you may experience a mild rash up to 24 hours after the test. This is not dangerous. If this occurs, you can take Benadryl 25 mg and increase your fluid intake. . If you experience trouble breathing, this can be serious. If it is severe call 911 IMMEDIATELY. If it is mild, please call our office. . If you take any of these medications: Glipizide/Metformin, Avandament, Glucavance, please do not take 48 hours after completing test unless otherwise  instructed.   Once we have confirmed authorization from your insurance company, we will call you to set up a date and time for your test.   For non-scheduling related questions, please contact the cardiac imaging nurse navigator should you have any questions/concerns: Marchia Bond, RN Navigator Cardiac Imaging [facility] [phone] office  For scheduling needs, including cancellations and rescheduling, please call [phone].

## 2019-04-29 ENCOUNTER — Other Ambulatory Visit: Payer: Self-pay | Admitting: *Deleted

## 2019-04-29 DIAGNOSIS — M545 Low back pain: Secondary | ICD-10-CM | POA: Diagnosis not present

## 2019-04-29 DIAGNOSIS — M9901 Segmental and somatic dysfunction of cervical region: Secondary | ICD-10-CM | POA: Diagnosis not present

## 2019-04-29 DIAGNOSIS — M546 Pain in thoracic spine: Secondary | ICD-10-CM | POA: Diagnosis not present

## 2019-04-29 DIAGNOSIS — M542 Cervicalgia: Secondary | ICD-10-CM | POA: Diagnosis not present

## 2019-04-29 DIAGNOSIS — M9903 Segmental and somatic dysfunction of lumbar region: Secondary | ICD-10-CM | POA: Diagnosis not present

## 2019-04-29 DIAGNOSIS — M9902 Segmental and somatic dysfunction of thoracic region: Secondary | ICD-10-CM | POA: Diagnosis not present

## 2019-04-29 DIAGNOSIS — E7849 Other hyperlipidemia: Secondary | ICD-10-CM

## 2019-04-29 LAB — LIPID PANEL
Chol/HDL Ratio: 5.9 ratio — ABNORMAL HIGH (ref 0.0–4.4)
Cholesterol, Total: 309 mg/dL — ABNORMAL HIGH (ref 100–199)
HDL: 52 mg/dL (ref >39)
LDL Chol Calc (NIH): 228 mg/dL — ABNORMAL HIGH (ref 0–99)
Triglycerides: 156 mg/dL — ABNORMAL HIGH (ref 0–149)
VLDL Cholesterol Cal: 29 mg/dL (ref 5–40)

## 2019-04-29 LAB — SEDIMENTATION RATE: Sed Rate: 8 mm/hr (ref 0–40)

## 2019-04-29 LAB — HIGH SENSITIVITY CRP: CRP, High Sensitivity: 2.52 mg/L (ref 0.00–3.00)

## 2019-05-03 NOTE — Progress Notes (Signed)
Patient ID: Teresa Mcdaniel                 DOB: March 19, 1955                    MRN: [id number]     HPI: Teresa Mcdaniel is a 64 y.o. female patient referred to lipid clinic by Dr. Katrinka Blazing. PMH is significant for HLD, bilateral carotid artery stenosis (followed by [facility]), mitral valve prolapse, HTN, and recent chest pain. Per patient, her last cardiac cath before 1990 was clean to her memory. Patient was last seen by Dr. Katrinka Blazing on 04/28/19 where she reported prior chest tightness and discomfort for 4 continuous days. She thought the discomfort felt similarly to previous pericarditis from 15 years ago. Lipid panel was elevated and above goal, so pt was referred to lipid clinic.  Patient arrives in good spirits for initial visit today. She states that she was not fasting when her lipids were checked last week which likely affected the results. She has tried simvastatin in the past but stopped it after experiencing myalgias. She is interested in starting a PCSK9 inhibitor but is willing to try rosuvastatin and ezetimibe before doing so. She does have an extensive cardiac family history but states that she stays active when she can and tries to stick to a heart healthy diet. Her diet is limited due to her mother's health restrictions and states that she has pork (bacon or sausage) and red meats occasionally. She is willing to increase her physical activity and to avoid foods high in saturated fats to help lower her cholesterol.   On 05/12/19, her insurance coverage will be changing to [employer]: Member ID: [id number] BIN: [id number] PCN: PartD Grp: [id number] RxPartB Bin: [id number] [id number] PCN: [id number]  Current Medications: none  Intolerances: simvastatin 40mg  daily (2015 - myalgias) Risk Factors: HLD, ASCVD (bilateral carotid disease), HTN, family history LDL goal: <70 mg/dL  Diet: cooks all meals since covid; eats lean chicken that's not fried; some beef; eats pork three times weekly (1 packet sausage or bacon  with breakfast)  Exercise: Uses Gazelle machine twice weekly for 20-30 minutes; enjoys walking and used to walk 2 miles daily  Family History: Fatal MI in father 66 years old), Angina in mother starting at age 25 s/p multiple PCIs with stent placements; HTN in maternal grandmother and grandfather; CHF in grandmother  Social History: Never smoker; does not drink alcohol  Labs:  04/28/19: TC 309, TG 156, HDL 52, LDL 228 (no lipid-lowering medications) 06/01/13: TC 326, TG 170, HDL 48, LDL 244 (no lipid-lowering medications)  Past Medical History:  Diagnosis Date  . Allergic rhinitis   . Anxiety   . Fibromyalgia   . Gastritis before 2000  . Hyperlipidemia   . Interstitial cystitis   . Mitral valve prolapse    Dx at time of pericarditis  . Pericarditis 1990 or before  . Rheumatism   . Stenosis of right carotid artery     Current Outpatient Medications on File Prior to Visit  Medication Sig Dispense Refill  . Ascorbic Acid (VITAMIN C PO) Take 1 tablet by mouth daily.    . Cholecalciferol (VITAMIN D-3 PO) Take 1 tablet by mouth daily.    . famotidine (PEPCID) 20 MG tablet Take 20 mg by mouth daily.    . fluticasone (FLONASE) 50 MCG/ACT nasal spray Place 2 sprays into both nostrils daily.    2001 GARLIC PO Take 1 tablet by mouth daily.    Marland Kitchen  loratadine (CLARITIN) 10 MG tablet Take 10 mg by mouth daily as needed for allergies.    Marland Kitchen LORazepam (ATIVAN) 0.5 MG tablet Take 0.5 mg by mouth daily as needed.    . metoprolol tartrate (LOPRESSOR) 100 MG tablet Take one tablet my mouth 2 hours prior to your CT. 1 tablet 0  . naproxen sodium (ANAPROX) 220 MG tablet Take 220 mg by mouth daily as needed (for pain).    . ranitidine (ZANTAC) 150 MG tablet Take 300 mg by mouth daily.     No current facility-administered medications on file prior to visit.    Allergies  Allergen Reactions  . Codeine Nausea Only  . Levaquin [Levofloxacin In D5w]   . Lodine [Etodolac]   . Nsaids Other (See Comments)      Stomach problems  . Tetracyclines & Related Swelling  . Sulfa Antibiotics Swelling    Assessment/Plan:  1. Hyperlipidemia - LDL is elevated and above goal of < 70 mg/dL. Will start rosuvastatin 20mg  daily. Will plan to start ezetimibe in 1 week when the medication is cheaper on new insurance (effective 05/12/19). Discussed the possibility of PCSK9 injections, but patient is willing to try rosuvastatin and ezetimibe prior to trying injections. Discussed the importance of maintaining a regular exercise regimen and sticking to a heart healthy diet low in saturated fats. She will increase her physical activity and decrease the amounts of bacon and sausage in her diet. Will recheck fasting labs on 07/06/19.  Richardine Service, PharmD PGY1 Pharmacy Resident

## 2019-05-05 ENCOUNTER — Other Ambulatory Visit: Payer: Self-pay

## 2019-05-05 ENCOUNTER — Encounter: Payer: Self-pay | Admitting: Pharmacist

## 2019-05-05 ENCOUNTER — Ambulatory Visit (INDEPENDENT_AMBULATORY_CARE_PROVIDER_SITE_OTHER): Payer: Medicare HMO | Admitting: Pharmacist

## 2019-05-05 DIAGNOSIS — E785 Hyperlipidemia, unspecified: Secondary | ICD-10-CM | POA: Insufficient documentation

## 2019-05-05 DIAGNOSIS — E782 Mixed hyperlipidemia: Secondary | ICD-10-CM

## 2019-05-05 DIAGNOSIS — I10 Essential (primary) hypertension: Secondary | ICD-10-CM

## 2019-05-05 DIAGNOSIS — I6523 Occlusion and stenosis of bilateral carotid arteries: Secondary | ICD-10-CM | POA: Insufficient documentation

## 2019-05-05 HISTORY — DX: Essential (primary) hypertension: I10

## 2019-05-05 MED ORDER — ROSUVASTATIN CALCIUM 20 MG PO TABS: 20.0000 mg | ORAL_TABLET | Freq: Every day | ORAL | 11 refills | Status: DC

## 2019-05-05 NOTE — Patient Instructions (Addendum)
It was nice to meet you today   Your LDL is 228 and your goal is < 70  START rosuvastatin 20mg  daily. This medication lowers your LDL cholesterol by greater than 50% and provides cardiovascular benefit  START ezetimibe 10mg  daily 1 week after starting rosuvastatin. This medication lowers your LDL cholesterol by 20% and provides cardiovascular benefit.    We will recheck your cholesterol in 2-3 months after starting the medications  Please call at [phone] if you have any questions

## 2019-05-06 ENCOUNTER — Telehealth: Payer: Self-pay | Admitting: Pharmacist

## 2019-05-06 MED ORDER — EZETIMIBE 10 MG PO TABS: 10.0000 mg | ORAL_TABLET | Freq: Every day | ORAL | 11 refills | Status: DC

## 2019-05-06 NOTE — Telephone Encounter (Signed)
Tier exception for ezetimibe was approved through 02/10/20. Mychart message sent to patient. Rx sent to pharmacy.

## 2019-05-10 ENCOUNTER — Ambulatory Visit
Admission: RE | Admit: 2019-05-10 | Discharge: 2019-05-10 | Disposition: A | Discharge status: Home / Self Care | Payer: Medicare HMO | Source: Ambulatory Visit | Attending: Neurology | Admitting: Neurology

## 2019-05-10 DIAGNOSIS — G4489 Other headache syndrome: Secondary | ICD-10-CM

## 2019-05-10 MED ORDER — GADOBENATE DIMEGLUMINE 529 MG/ML IV SOLN
12.0000 mL | Freq: Once | INTRAVENOUS | Status: AC | PRN
  Administered 2019-05-10: 12 mL via INTRAVENOUS

## 2019-05-12 ENCOUNTER — Telehealth: Payer: Self-pay | Admitting: Neurology

## 2019-05-12 NOTE — Telephone Encounter (Signed)
  I called the patient.,  The MRI of the brain was unremarkable, she is to contact her office if she requires any medication adjustments.  Patient likely has a stress-induced headache.   MRI brain 05/11/19:  IMPRESSION:   Unremarkable MRI brain (with and without). No acute findings.

## 2019-05-18 DIAGNOSIS — M542 Cervicalgia: Secondary | ICD-10-CM | POA: Diagnosis not present

## 2019-05-18 DIAGNOSIS — M9903 Segmental and somatic dysfunction of lumbar region: Secondary | ICD-10-CM | POA: Diagnosis not present

## 2019-05-18 DIAGNOSIS — M9902 Segmental and somatic dysfunction of thoracic region: Secondary | ICD-10-CM | POA: Diagnosis not present

## 2019-05-18 DIAGNOSIS — M545 Low back pain: Secondary | ICD-10-CM | POA: Diagnosis not present

## 2019-05-18 DIAGNOSIS — M9901 Segmental and somatic dysfunction of cervical region: Secondary | ICD-10-CM | POA: Diagnosis not present

## 2019-05-18 DIAGNOSIS — M546 Pain in thoracic spine: Secondary | ICD-10-CM | POA: Diagnosis not present

## 2019-05-20 ENCOUNTER — Encounter (HOSPITAL_COMMUNITY): Payer: Self-pay

## 2019-05-20 NOTE — Telephone Encounter (Signed)
My chart message not read. Left message on VM (perDPR) that tier exception was approved.

## 2019-05-21 ENCOUNTER — Telehealth (HOSPITAL_COMMUNITY): Payer: Self-pay | Admitting: Emergency Medicine

## 2019-05-21 MED ORDER — METOPROLOL TARTRATE 100 MG PO TABS: ORAL_TABLET | ORAL | 0 refills | Status: DC

## 2019-05-21 NOTE — Telephone Encounter (Signed)
Reaching out to patient to offer assistance regarding upcoming cardiac imaging study; pt verbalizes understanding of appt date/time, parking situation and where to check in, pre-test NPO status and medications ordered, and verified current allergies; name and call back number provided for further questions should they arise Rockwell Alexandria RN Navigator Cardiac Imaging [facility] [phone] office [phone] cell  Pt unaware to pick up medication metoprolol. Verified her pharmacy ([facility], not [facility]) - resent to correct pharm  Pt appreciated the call Huntley Dec

## 2019-05-23 ENCOUNTER — Encounter (HOSPITAL_COMMUNITY): Payer: Self-pay

## 2019-05-23 ENCOUNTER — Other Ambulatory Visit: Payer: Self-pay

## 2019-05-23 ENCOUNTER — Ambulatory Visit (HOSPITAL_COMMUNITY)
Admission: RE | Admit: 2019-05-23 | Discharge: 2019-05-23 | Disposition: A | Discharge status: Home / Self Care | Payer: PPO | Source: Ambulatory Visit | Attending: Interventional Cardiology | Admitting: Interventional Cardiology

## 2019-05-23 DIAGNOSIS — R072 Precordial pain: Secondary | ICD-10-CM | POA: Diagnosis not present

## 2019-05-23 DIAGNOSIS — I2510 Atherosclerotic heart disease of native coronary artery without angina pectoris: Secondary | ICD-10-CM | POA: Diagnosis not present

## 2019-05-23 MED ORDER — NITROGLYCERIN 0.4 MG SL SUBL
SUBLINGUAL_TABLET | SUBLINGUAL | Status: AC
  Administered 2019-05-23: 0.8 mg via SUBLINGUAL
  Filled 2019-05-23: qty 2

## 2019-05-23 MED ORDER — NITROGLYCERIN 0.4 MG SL SUBL: 0.8000 mg | SUBLINGUAL_TABLET | Freq: Once | SUBLINGUAL | Status: AC

## 2019-05-23 MED ORDER — IOHEXOL 350 MG/ML SOLN
80.0000 mL | Freq: Once | INTRAVENOUS | Status: AC | PRN
  Administered 2019-05-23: 80 mL via INTRAVENOUS

## 2019-05-24 ENCOUNTER — Ambulatory Visit (HOSPITAL_COMMUNITY)
Admission: RE | Admit: 2019-05-24 | Discharge: 2019-05-24 | Disposition: A | Discharge status: Home / Self Care | Payer: PPO | Source: Ambulatory Visit | Attending: Interventional Cardiology | Admitting: Interventional Cardiology

## 2019-05-24 DIAGNOSIS — I2510 Atherosclerotic heart disease of native coronary artery without angina pectoris: Secondary | ICD-10-CM | POA: Diagnosis not present

## 2019-05-24 DIAGNOSIS — R072 Precordial pain: Secondary | ICD-10-CM | POA: Diagnosis not present

## 2019-05-27 DIAGNOSIS — M9901 Segmental and somatic dysfunction of cervical region: Secondary | ICD-10-CM | POA: Diagnosis not present

## 2019-05-27 DIAGNOSIS — M546 Pain in thoracic spine: Secondary | ICD-10-CM | POA: Diagnosis not present

## 2019-05-27 DIAGNOSIS — M545 Low back pain: Secondary | ICD-10-CM | POA: Diagnosis not present

## 2019-05-27 DIAGNOSIS — M9903 Segmental and somatic dysfunction of lumbar region: Secondary | ICD-10-CM | POA: Diagnosis not present

## 2019-05-27 DIAGNOSIS — M542 Cervicalgia: Secondary | ICD-10-CM | POA: Diagnosis not present

## 2019-05-27 DIAGNOSIS — M9902 Segmental and somatic dysfunction of thoracic region: Secondary | ICD-10-CM | POA: Diagnosis not present

## 2019-05-31 ENCOUNTER — Telehealth: Payer: Self-pay | Admitting: Pharmacist

## 2019-05-31 NOTE — Telephone Encounter (Signed)
Called pt to follow up with lipids. She states she stopped taking both rosuvastatin and ezetimibe due to GI upset. She was unsure which medication was causing her stomach upset. Advised pt that of the two medications, ezetimibe is more likely to cause stomach upset.  She is willing to restart her rosuvastatin 20mg  daily. Moved out follow up labs to June to assess efficacy. Advised pt to call clinic if she has any trouble tolerating this as we can try [id number] at that time.

## 2019-05-31 NOTE — Progress Notes (Signed)
Waiting on FFR before doing anything else.

## 2019-06-01 ENCOUNTER — Other Ambulatory Visit: Payer: Self-pay | Admitting: *Deleted

## 2019-06-01 DIAGNOSIS — E782 Mixed hyperlipidemia: Secondary | ICD-10-CM

## 2019-06-01 NOTE — Addendum Note (Signed)
Addended by: Julio Sicks on: 06/01/2019 03:42 PM   Modules accepted: Orders

## 2019-06-13 DIAGNOSIS — N390 Urinary tract infection, site not specified: Secondary | ICD-10-CM | POA: Diagnosis not present

## 2019-06-13 DIAGNOSIS — N760 Acute vaginitis: Secondary | ICD-10-CM | POA: Diagnosis not present

## 2019-07-06 ENCOUNTER — Other Ambulatory Visit: Payer: Medicare HMO
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
## 2019-07-14 ENCOUNTER — Ambulatory Visit: Payer: PPO | Admitting: Neurology

## 2019-07-14 ENCOUNTER — Encounter: Payer: Self-pay | Admitting: Neurology

## 2019-07-14 ENCOUNTER — Other Ambulatory Visit: Payer: Self-pay

## 2019-07-14 DIAGNOSIS — G44221 Chronic tension-type headache, intractable: Secondary | ICD-10-CM | POA: Diagnosis not present

## 2019-07-14 DIAGNOSIS — R519 Headache, unspecified: Secondary | ICD-10-CM | POA: Insufficient documentation

## 2019-07-14 MED ORDER — NORTRIPTYLINE HCL 10 MG PO CAPS: 10.0000 mg | ORAL_CAPSULE | Freq: Every day | ORAL | 5 refills | Status: AC

## 2019-07-14 NOTE — Patient Instructions (Signed)
Start nortriptyline 10 mg at bedtime for headache  See you back in 6 months  Nortriptyline capsules What is this medicine? NORTRIPTYLINE (nor TRIP ti leen) is used to treat depression. This medicine may be used for other purposes; ask your health care provider or pharmacist if you have questions. COMMON BRAND NAME(S): Aventyl, Pamelor What should I tell my health care provider before I take this medicine? They need to know if you have any of these conditions:  bipolar disorder  Brugada syndrome  difficulty passing urine  glaucoma  heart disease  if you drink alcohol  liver disease  schizophrenia  seizures  suicidal thoughts, plans or attempt; a previous suicide attempt by you or a family member  thyroid disease  an unusual or allergic reaction to nortriptyline, other tricyclic antidepressants, other medicines, foods, dyes, or preservatives  pregnant or trying to get pregnant  breast-feeding How should I use this medicine? Take this medicine by mouth with a glass of water. Follow the directions on the prescription label. Take your doses at regular intervals. Do not take it more often than directed. Do not stop taking this medicine suddenly except upon the advice of your doctor. Stopping this medicine too quickly may cause serious side effects or your condition may worsen. A special MedGuide will be given to you by the pharmacist with each prescription and refill. Be sure to read this information carefully each time. Talk to your pediatrician regarding the use of this medicine in children. Special care may be needed. Overdosage: If you think you have taken too much of this medicine contact a poison control center or emergency room at once. NOTE: This medicine is only for you. Do not share this medicine with others. What if I miss a dose? If you miss a dose, take it as soon as you can. If it is almost time for your next dose, take only that dose. Do not take double or extra  doses. What may interact with this medicine? Do not take this medicine with any of the following medications:  cisapride  dronedarone  linezolid  MAOIs like Carbex, Eldepryl, Marplan, Nardil, and Parnate  methylene blue (injected into a vein)  pimozide  thioridazine This medicine may also interact with the following medications:  alcohol  antihistamines for allergy, cough, and cold  atropine  certain medicines for bladder problems like oxybutynin, tolterodine  certain medicines for depression like amitriptyline, fluoxetine, sertraline  certain medicines for Parkinson's disease like benztropine, trihexyphenidyl  certain medicines for stomach problems like dicyclomine, hyoscyamine  certain medicines for travel sickness like scopolamine  chlorpropamide  cimetidine  ipratropium  other medicines that prolong the QT interval (an abnormal heart rhythm) like dofetilide  other medicines that can cause serotonin syndrome like St. John's Wort, fentanyl, lithium, tramadol, tryptophan, buspirone, and some medicines for headaches like sumatriptan or rizatriptan  quinidine  reserpine  thyroid medicine This list may not describe all possible interactions. Give your health care provider a list of all the medicines, herbs, non-prescription drugs, or dietary supplements you use. Also tell them if you smoke, drink alcohol, or use illegal drugs. Some items may interact with your medicine. What should I watch for while using this medicine? Tell your doctor if your symptoms do not get better or if they get worse. Visit your doctor or health care professional for regular checks on your progress. Because it may take several weeks to see the full effects of this medicine, it is important to continue your treatment as  prescribed by your doctor. Patients and their families should watch out for new or worsening thoughts of suicide or depression. Also watch out for sudden changes in feelings  such as feeling anxious, agitated, panicky, irritable, hostile, aggressive, impulsive, severely restless, overly excited and hyperactive, or not being able to sleep. If this happens, especially at the beginning of treatment or after a change in dose, call your health care professional. Dennis Bast may get drowsy or dizzy. Do not drive, use machinery, or do anything that needs mental alertness until you know how this medicine affects you. Do not stand or sit up quickly, especially if you are an older patient. This reduces the risk of dizzy or fainting spells. Alcohol may interfere with the effect of this medicine. Avoid alcoholic drinks. Do not treat yourself for coughs, colds, or allergies without asking your doctor or health care professional for advice. Some ingredients can increase possible side effects. Your mouth may get dry. Chewing sugarless gum or sucking hard candy, and drinking plenty of water may help. Contact your doctor if the problem does not go away or is severe. This medicine may cause dry eyes and blurred vision. If you wear contact lenses you may feel some discomfort. Lubricating drops may help. See your eye doctor if the problem does not go away or is severe. This medicine can cause constipation. Try to have a bowel movement at least every 2 to 3 days. If you do not have a bowel movement for 3 days, call your doctor or health care professional. This medicine can make you more sensitive to the sun. Keep out of the sun. If you cannot avoid being in the sun, wear protective clothing and use sunscreen. Do not use sun lamps or tanning beds/booths. What side effects may I notice from receiving this medicine? Side effects that you should report to your doctor or health care professional as soon as possible:  allergic reactions like skin rash, itching or hives, swelling of the face, lips, or tongue  anxious  breathing problems  changes in vision  confusion  elevated mood, decreased need for  sleep, racing thoughts, impulsive behavior  eye pain  fast, irregular heartbeat  feeling faint or lightheaded, falls  feeling agitated, angry, or irritable  fever with increased sweating  hallucination, loss of contact with reality  seizures  stiff muscles  suicidal thoughts or other mood changes  tingling, pain, or numbness in the feet or hands  trouble passing urine or change in the amount of urine  trouble sleeping  unusually weak or tired  vomiting  yellowing of the eyes or skin Side effects that usually do not require medical attention (report to your doctor or health care professional if they continue or are bothersome):  change in sex drive or performance  change in appetite or weight  constipation  dizziness  dry mouth  nausea  tired  tremors  upset stomach This list may not describe all possible side effects. Call your doctor for medical advice about side effects. You may report side effects to FDA at 1-800-FDA-1088. Where should I keep my medicine? Keep out of the reach of children. Store at room temperature between 15 and 30 degrees C (59 and 86 degrees F). Keep container tightly closed. Throw away any unused medicine after the expiration date. NOTE: This sheet is a summary. It may not cover all possible information. If you have questions about this medicine, talk to your doctor, pharmacist, or health care provider.  2020 Elsevier/Gold Standard (  2018-01-19 13:24:58)  

## 2019-07-14 NOTE — Progress Notes (Signed)
I have read the note, and I agree with the clinical assessment and plan.  Shanyce Daris K Dheeraj Hail   

## 2019-07-27 DIAGNOSIS — M9905 Segmental and somatic dysfunction of pelvic region: Secondary | ICD-10-CM | POA: Diagnosis not present

## 2019-07-27 DIAGNOSIS — M546 Pain in thoracic spine: Secondary | ICD-10-CM | POA: Diagnosis not present

## 2019-07-27 DIAGNOSIS — M9903 Segmental and somatic dysfunction of lumbar region: Secondary | ICD-10-CM | POA: Diagnosis not present

## 2019-07-27 DIAGNOSIS — M545 Low back pain: Secondary | ICD-10-CM | POA: Diagnosis not present

## 2019-07-27 DIAGNOSIS — M542 Cervicalgia: Secondary | ICD-10-CM | POA: Diagnosis not present

## 2019-07-27 DIAGNOSIS — M9901 Segmental and somatic dysfunction of cervical region: Secondary | ICD-10-CM | POA: Diagnosis not present

## 2019-07-27 DIAGNOSIS — M9902 Segmental and somatic dysfunction of thoracic region: Secondary | ICD-10-CM | POA: Diagnosis not present

## 2019-08-03 ENCOUNTER — Other Ambulatory Visit: Payer: PPO

## 2019-09-07 DIAGNOSIS — M9903 Segmental and somatic dysfunction of lumbar region: Secondary | ICD-10-CM | POA: Diagnosis not present

## 2019-09-07 DIAGNOSIS — M545 Low back pain: Secondary | ICD-10-CM | POA: Diagnosis not present

## 2019-09-07 DIAGNOSIS — M9905 Segmental and somatic dysfunction of pelvic region: Secondary | ICD-10-CM | POA: Diagnosis not present

## 2019-09-07 DIAGNOSIS — M546 Pain in thoracic spine: Secondary | ICD-10-CM | POA: Diagnosis not present

## 2019-09-07 DIAGNOSIS — M9902 Segmental and somatic dysfunction of thoracic region: Secondary | ICD-10-CM | POA: Diagnosis not present

## 2019-09-07 DIAGNOSIS — M542 Cervicalgia: Secondary | ICD-10-CM | POA: Diagnosis not present

## 2019-09-07 DIAGNOSIS — M9901 Segmental and somatic dysfunction of cervical region: Secondary | ICD-10-CM | POA: Diagnosis not present

## 2019-09-08 DIAGNOSIS — R079 Chest pain, unspecified: Secondary | ICD-10-CM | POA: Diagnosis not present

## 2019-09-14 DIAGNOSIS — M9902 Segmental and somatic dysfunction of thoracic region: Secondary | ICD-10-CM | POA: Diagnosis not present

## 2019-09-14 DIAGNOSIS — M542 Cervicalgia: Secondary | ICD-10-CM | POA: Diagnosis not present

## 2019-09-14 DIAGNOSIS — M9901 Segmental and somatic dysfunction of cervical region: Secondary | ICD-10-CM | POA: Diagnosis not present

## 2019-09-14 DIAGNOSIS — M9905 Segmental and somatic dysfunction of pelvic region: Secondary | ICD-10-CM | POA: Diagnosis not present

## 2019-09-14 DIAGNOSIS — M545 Low back pain: Secondary | ICD-10-CM | POA: Diagnosis not present

## 2019-09-14 DIAGNOSIS — M546 Pain in thoracic spine: Secondary | ICD-10-CM | POA: Diagnosis not present

## 2019-09-14 DIAGNOSIS — M9903 Segmental and somatic dysfunction of lumbar region: Secondary | ICD-10-CM | POA: Diagnosis not present

## 2019-09-20 DIAGNOSIS — Z8249 Family history of ischemic heart disease and other diseases of the circulatory system: Secondary | ICD-10-CM | POA: Diagnosis not present

## 2019-09-20 DIAGNOSIS — Z881 Allergy status to other antibiotic agents status: Secondary | ICD-10-CM | POA: Diagnosis not present

## 2019-09-20 DIAGNOSIS — Z79899 Other long term (current) drug therapy: Secondary | ICD-10-CM | POA: Diagnosis not present

## 2019-09-20 DIAGNOSIS — Z8679 Personal history of other diseases of the circulatory system: Secondary | ICD-10-CM | POA: Diagnosis not present

## 2019-09-20 DIAGNOSIS — Z8639 Personal history of other endocrine, nutritional and metabolic disease: Secondary | ICD-10-CM | POA: Diagnosis not present

## 2019-09-20 DIAGNOSIS — Z7982 Long term (current) use of aspirin: Secondary | ICD-10-CM | POA: Diagnosis not present

## 2019-09-20 DIAGNOSIS — I2510 Atherosclerotic heart disease of native coronary artery without angina pectoris: Secondary | ICD-10-CM | POA: Diagnosis not present

## 2019-09-23 DIAGNOSIS — M9905 Segmental and somatic dysfunction of pelvic region: Secondary | ICD-10-CM | POA: Diagnosis not present

## 2019-09-23 DIAGNOSIS — M9902 Segmental and somatic dysfunction of thoracic region: Secondary | ICD-10-CM | POA: Diagnosis not present

## 2019-09-23 DIAGNOSIS — M546 Pain in thoracic spine: Secondary | ICD-10-CM | POA: Diagnosis not present

## 2019-09-23 DIAGNOSIS — M545 Low back pain: Secondary | ICD-10-CM | POA: Diagnosis not present

## 2019-09-23 DIAGNOSIS — M9903 Segmental and somatic dysfunction of lumbar region: Secondary | ICD-10-CM | POA: Diagnosis not present

## 2019-09-23 DIAGNOSIS — M9901 Segmental and somatic dysfunction of cervical region: Secondary | ICD-10-CM | POA: Diagnosis not present

## 2019-09-23 DIAGNOSIS — M542 Cervicalgia: Secondary | ICD-10-CM | POA: Diagnosis not present

## 2019-10-06 DIAGNOSIS — E7800 Pure hypercholesterolemia, unspecified: Secondary | ICD-10-CM | POA: Diagnosis not present

## 2019-10-06 DIAGNOSIS — K219 Gastro-esophageal reflux disease without esophagitis: Secondary | ICD-10-CM | POA: Diagnosis not present

## 2019-10-06 DIAGNOSIS — N3010 Interstitial cystitis (chronic) without hematuria: Secondary | ICD-10-CM | POA: Diagnosis not present

## 2019-10-06 DIAGNOSIS — M797 Fibromyalgia: Secondary | ICD-10-CM | POA: Diagnosis not present

## 2019-10-06 DIAGNOSIS — R110 Nausea: Secondary | ICD-10-CM | POA: Diagnosis not present

## 2019-10-25 DIAGNOSIS — E7800 Pure hypercholesterolemia, unspecified: Secondary | ICD-10-CM | POA: Diagnosis not present

## 2019-10-25 DIAGNOSIS — M542 Cervicalgia: Secondary | ICD-10-CM | POA: Diagnosis not present

## 2019-10-25 DIAGNOSIS — M9901 Segmental and somatic dysfunction of cervical region: Secondary | ICD-10-CM | POA: Diagnosis not present

## 2019-10-25 DIAGNOSIS — M9902 Segmental and somatic dysfunction of thoracic region: Secondary | ICD-10-CM | POA: Diagnosis not present

## 2019-10-25 DIAGNOSIS — M9905 Segmental and somatic dysfunction of pelvic region: Secondary | ICD-10-CM | POA: Diagnosis not present

## 2019-10-25 DIAGNOSIS — I2510 Atherosclerotic heart disease of native coronary artery without angina pectoris: Secondary | ICD-10-CM | POA: Diagnosis not present

## 2019-10-25 DIAGNOSIS — R079 Chest pain, unspecified: Secondary | ICD-10-CM | POA: Diagnosis not present

## 2019-10-25 DIAGNOSIS — M546 Pain in thoracic spine: Secondary | ICD-10-CM | POA: Diagnosis not present

## 2019-10-25 DIAGNOSIS — M9903 Segmental and somatic dysfunction of lumbar region: Secondary | ICD-10-CM | POA: Diagnosis not present

## 2019-10-25 DIAGNOSIS — M545 Low back pain: Secondary | ICD-10-CM | POA: Diagnosis not present

## 2019-12-05 DIAGNOSIS — M546 Pain in thoracic spine: Secondary | ICD-10-CM | POA: Diagnosis not present

## 2019-12-05 DIAGNOSIS — M9905 Segmental and somatic dysfunction of pelvic region: Secondary | ICD-10-CM | POA: Diagnosis not present

## 2019-12-05 DIAGNOSIS — M9903 Segmental and somatic dysfunction of lumbar region: Secondary | ICD-10-CM | POA: Diagnosis not present

## 2019-12-05 DIAGNOSIS — M9901 Segmental and somatic dysfunction of cervical region: Secondary | ICD-10-CM | POA: Diagnosis not present

## 2019-12-05 DIAGNOSIS — M542 Cervicalgia: Secondary | ICD-10-CM | POA: Diagnosis not present

## 2019-12-05 DIAGNOSIS — M9902 Segmental and somatic dysfunction of thoracic region: Secondary | ICD-10-CM | POA: Diagnosis not present

## 2019-12-09 DIAGNOSIS — M9903 Segmental and somatic dysfunction of lumbar region: Secondary | ICD-10-CM | POA: Diagnosis not present

## 2019-12-09 DIAGNOSIS — M542 Cervicalgia: Secondary | ICD-10-CM | POA: Diagnosis not present

## 2019-12-09 DIAGNOSIS — M9901 Segmental and somatic dysfunction of cervical region: Secondary | ICD-10-CM | POA: Diagnosis not present

## 2019-12-09 DIAGNOSIS — M546 Pain in thoracic spine: Secondary | ICD-10-CM | POA: Diagnosis not present

## 2019-12-09 DIAGNOSIS — M9902 Segmental and somatic dysfunction of thoracic region: Secondary | ICD-10-CM | POA: Diagnosis not present

## 2019-12-09 DIAGNOSIS — M9905 Segmental and somatic dysfunction of pelvic region: Secondary | ICD-10-CM | POA: Diagnosis not present

## 2020-01-19 ENCOUNTER — Ambulatory Visit: Payer: PPO | Admitting: Neurology

## 2020-02-07 DIAGNOSIS — M9901 Segmental and somatic dysfunction of cervical region: Secondary | ICD-10-CM | POA: Diagnosis not present

## 2020-02-07 DIAGNOSIS — M546 Pain in thoracic spine: Secondary | ICD-10-CM | POA: Diagnosis not present

## 2020-02-07 DIAGNOSIS — M542 Cervicalgia: Secondary | ICD-10-CM | POA: Diagnosis not present

## 2020-02-07 DIAGNOSIS — M9903 Segmental and somatic dysfunction of lumbar region: Secondary | ICD-10-CM | POA: Diagnosis not present

## 2020-02-07 DIAGNOSIS — M9905 Segmental and somatic dysfunction of pelvic region: Secondary | ICD-10-CM | POA: Diagnosis not present

## 2020-02-07 DIAGNOSIS — M9902 Segmental and somatic dysfunction of thoracic region: Secondary | ICD-10-CM | POA: Diagnosis not present

## 2020-03-13 DIAGNOSIS — B029 Zoster without complications: Secondary | ICD-10-CM | POA: Diagnosis not present

## 2020-03-26 DIAGNOSIS — I6523 Occlusion and stenosis of bilateral carotid arteries: Secondary | ICD-10-CM | POA: Diagnosis not present

## 2020-03-26 DIAGNOSIS — I6529 Occlusion and stenosis of unspecified carotid artery: Secondary | ICD-10-CM | POA: Diagnosis not present

## 2020-03-26 DIAGNOSIS — E785 Hyperlipidemia, unspecified: Secondary | ICD-10-CM | POA: Diagnosis not present

## 2020-03-26 DIAGNOSIS — M797 Fibromyalgia: Secondary | ICD-10-CM | POA: Diagnosis not present

## 2020-03-26 DIAGNOSIS — I2510 Atherosclerotic heart disease of native coronary artery without angina pectoris: Secondary | ICD-10-CM | POA: Diagnosis not present

## 2020-04-06 DIAGNOSIS — M9901 Segmental and somatic dysfunction of cervical region: Secondary | ICD-10-CM | POA: Diagnosis not present

## 2020-04-06 DIAGNOSIS — M9903 Segmental and somatic dysfunction of lumbar region: Secondary | ICD-10-CM | POA: Diagnosis not present

## 2020-04-06 DIAGNOSIS — M9902 Segmental and somatic dysfunction of thoracic region: Secondary | ICD-10-CM | POA: Diagnosis not present

## 2020-04-06 DIAGNOSIS — M9905 Segmental and somatic dysfunction of pelvic region: Secondary | ICD-10-CM | POA: Diagnosis not present

## 2020-04-06 DIAGNOSIS — M546 Pain in thoracic spine: Secondary | ICD-10-CM | POA: Diagnosis not present

## 2020-04-06 DIAGNOSIS — M542 Cervicalgia: Secondary | ICD-10-CM | POA: Diagnosis not present

## 2020-04-11 DIAGNOSIS — M9905 Segmental and somatic dysfunction of pelvic region: Secondary | ICD-10-CM | POA: Diagnosis not present

## 2020-04-11 DIAGNOSIS — M9903 Segmental and somatic dysfunction of lumbar region: Secondary | ICD-10-CM | POA: Diagnosis not present

## 2020-04-11 DIAGNOSIS — M9902 Segmental and somatic dysfunction of thoracic region: Secondary | ICD-10-CM | POA: Diagnosis not present

## 2020-04-11 DIAGNOSIS — M9901 Segmental and somatic dysfunction of cervical region: Secondary | ICD-10-CM | POA: Diagnosis not present

## 2020-04-11 DIAGNOSIS — M542 Cervicalgia: Secondary | ICD-10-CM | POA: Diagnosis not present

## 2020-04-11 DIAGNOSIS — M546 Pain in thoracic spine: Secondary | ICD-10-CM | POA: Diagnosis not present

## 2020-05-11 DIAGNOSIS — M9902 Segmental and somatic dysfunction of thoracic region: Secondary | ICD-10-CM | POA: Diagnosis not present

## 2020-05-11 DIAGNOSIS — M9903 Segmental and somatic dysfunction of lumbar region: Secondary | ICD-10-CM | POA: Diagnosis not present

## 2020-05-11 DIAGNOSIS — M546 Pain in thoracic spine: Secondary | ICD-10-CM | POA: Diagnosis not present

## 2020-05-11 DIAGNOSIS — M542 Cervicalgia: Secondary | ICD-10-CM | POA: Diagnosis not present

## 2020-05-11 DIAGNOSIS — M9905 Segmental and somatic dysfunction of pelvic region: Secondary | ICD-10-CM | POA: Diagnosis not present

## 2020-05-11 DIAGNOSIS — M9901 Segmental and somatic dysfunction of cervical region: Secondary | ICD-10-CM | POA: Diagnosis not present

## 2020-07-06 DIAGNOSIS — M9902 Segmental and somatic dysfunction of thoracic region: Secondary | ICD-10-CM | POA: Diagnosis not present

## 2020-07-06 DIAGNOSIS — M9903 Segmental and somatic dysfunction of lumbar region: Secondary | ICD-10-CM | POA: Diagnosis not present

## 2020-07-06 DIAGNOSIS — M546 Pain in thoracic spine: Secondary | ICD-10-CM | POA: Diagnosis not present

## 2020-07-06 DIAGNOSIS — M542 Cervicalgia: Secondary | ICD-10-CM | POA: Diagnosis not present

## 2020-07-06 DIAGNOSIS — M9901 Segmental and somatic dysfunction of cervical region: Secondary | ICD-10-CM | POA: Diagnosis not present

## 2020-07-06 DIAGNOSIS — M9905 Segmental and somatic dysfunction of pelvic region: Secondary | ICD-10-CM | POA: Diagnosis not present

## 2020-08-03 DIAGNOSIS — M542 Cervicalgia: Secondary | ICD-10-CM | POA: Diagnosis not present

## 2020-08-03 DIAGNOSIS — M9902 Segmental and somatic dysfunction of thoracic region: Secondary | ICD-10-CM | POA: Diagnosis not present

## 2020-08-03 DIAGNOSIS — M546 Pain in thoracic spine: Secondary | ICD-10-CM | POA: Diagnosis not present

## 2020-08-03 DIAGNOSIS — M9901 Segmental and somatic dysfunction of cervical region: Secondary | ICD-10-CM | POA: Diagnosis not present

## 2020-08-03 DIAGNOSIS — M9905 Segmental and somatic dysfunction of pelvic region: Secondary | ICD-10-CM | POA: Diagnosis not present

## 2020-08-03 DIAGNOSIS — M9903 Segmental and somatic dysfunction of lumbar region: Secondary | ICD-10-CM | POA: Diagnosis not present

## 2020-08-08 DIAGNOSIS — F419 Anxiety disorder, unspecified: Secondary | ICD-10-CM | POA: Diagnosis not present

## 2020-08-08 DIAGNOSIS — M791 Myalgia, unspecified site: Secondary | ICD-10-CM | POA: Diagnosis not present

## 2020-08-20 ENCOUNTER — Other Ambulatory Visit: Payer: Self-pay | Admitting: Family Medicine

## 2020-08-20 DIAGNOSIS — R519 Headache, unspecified: Secondary | ICD-10-CM

## 2020-08-20 DIAGNOSIS — G4489 Other headache syndrome: Secondary | ICD-10-CM

## 2020-08-23 ENCOUNTER — Ambulatory Visit
Admission: RE | Admit: 2020-08-23 | Discharge: 2020-08-23 | Disposition: A | Discharge status: Home / Self Care | Payer: PPO | Source: Ambulatory Visit | Attending: Family Medicine | Admitting: Family Medicine

## 2020-08-23 DIAGNOSIS — R519 Headache, unspecified: Secondary | ICD-10-CM | POA: Diagnosis not present

## 2020-08-23 DIAGNOSIS — G4489 Other headache syndrome: Secondary | ICD-10-CM

## 2020-10-05 DIAGNOSIS — M9905 Segmental and somatic dysfunction of pelvic region: Secondary | ICD-10-CM | POA: Diagnosis not present

## 2020-10-05 DIAGNOSIS — M9902 Segmental and somatic dysfunction of thoracic region: Secondary | ICD-10-CM | POA: Diagnosis not present

## 2020-10-05 DIAGNOSIS — M9903 Segmental and somatic dysfunction of lumbar region: Secondary | ICD-10-CM | POA: Diagnosis not present

## 2020-10-05 DIAGNOSIS — M542 Cervicalgia: Secondary | ICD-10-CM | POA: Diagnosis not present

## 2020-10-05 DIAGNOSIS — M9901 Segmental and somatic dysfunction of cervical region: Secondary | ICD-10-CM | POA: Diagnosis not present

## 2020-10-05 DIAGNOSIS — M546 Pain in thoracic spine: Secondary | ICD-10-CM | POA: Diagnosis not present

## 2020-10-29 DIAGNOSIS — M9902 Segmental and somatic dysfunction of thoracic region: Secondary | ICD-10-CM | POA: Diagnosis not present

## 2020-10-29 DIAGNOSIS — M542 Cervicalgia: Secondary | ICD-10-CM | POA: Diagnosis not present

## 2020-10-29 DIAGNOSIS — M9901 Segmental and somatic dysfunction of cervical region: Secondary | ICD-10-CM | POA: Diagnosis not present

## 2020-10-29 DIAGNOSIS — M9903 Segmental and somatic dysfunction of lumbar region: Secondary | ICD-10-CM | POA: Diagnosis not present

## 2020-10-29 DIAGNOSIS — M9905 Segmental and somatic dysfunction of pelvic region: Secondary | ICD-10-CM | POA: Diagnosis not present

## 2020-10-29 DIAGNOSIS — M546 Pain in thoracic spine: Secondary | ICD-10-CM | POA: Diagnosis not present

## 2020-11-30 DIAGNOSIS — M546 Pain in thoracic spine: Secondary | ICD-10-CM | POA: Diagnosis not present

## 2020-11-30 DIAGNOSIS — M9902 Segmental and somatic dysfunction of thoracic region: Secondary | ICD-10-CM | POA: Diagnosis not present

## 2020-11-30 DIAGNOSIS — M9903 Segmental and somatic dysfunction of lumbar region: Secondary | ICD-10-CM | POA: Diagnosis not present

## 2020-11-30 DIAGNOSIS — M9905 Segmental and somatic dysfunction of pelvic region: Secondary | ICD-10-CM | POA: Diagnosis not present

## 2020-11-30 DIAGNOSIS — M542 Cervicalgia: Secondary | ICD-10-CM | POA: Diagnosis not present

## 2020-11-30 DIAGNOSIS — M9901 Segmental and somatic dysfunction of cervical region: Secondary | ICD-10-CM | POA: Diagnosis not present

## 2021-01-28 DIAGNOSIS — M542 Cervicalgia: Secondary | ICD-10-CM | POA: Diagnosis not present

## 2021-01-28 DIAGNOSIS — M9902 Segmental and somatic dysfunction of thoracic region: Secondary | ICD-10-CM | POA: Diagnosis not present

## 2021-01-28 DIAGNOSIS — M9905 Segmental and somatic dysfunction of pelvic region: Secondary | ICD-10-CM | POA: Diagnosis not present

## 2021-01-28 DIAGNOSIS — M9901 Segmental and somatic dysfunction of cervical region: Secondary | ICD-10-CM | POA: Diagnosis not present

## 2021-01-28 DIAGNOSIS — M546 Pain in thoracic spine: Secondary | ICD-10-CM | POA: Diagnosis not present

## 2021-01-28 DIAGNOSIS — M9903 Segmental and somatic dysfunction of lumbar region: Secondary | ICD-10-CM | POA: Diagnosis not present

## 2021-03-04 DIAGNOSIS — M9901 Segmental and somatic dysfunction of cervical region: Secondary | ICD-10-CM | POA: Diagnosis not present

## 2021-03-04 DIAGNOSIS — M9905 Segmental and somatic dysfunction of pelvic region: Secondary | ICD-10-CM | POA: Diagnosis not present

## 2021-03-04 DIAGNOSIS — M9903 Segmental and somatic dysfunction of lumbar region: Secondary | ICD-10-CM | POA: Diagnosis not present

## 2021-03-04 DIAGNOSIS — M9902 Segmental and somatic dysfunction of thoracic region: Secondary | ICD-10-CM | POA: Diagnosis not present

## 2021-03-04 DIAGNOSIS — M546 Pain in thoracic spine: Secondary | ICD-10-CM | POA: Diagnosis not present

## 2021-03-04 DIAGNOSIS — M542 Cervicalgia: Secondary | ICD-10-CM | POA: Diagnosis not present

## 2021-03-07 DIAGNOSIS — H52203 Unspecified astigmatism, bilateral: Secondary | ICD-10-CM | POA: Diagnosis not present

## 2021-03-07 DIAGNOSIS — H25013 Cortical age-related cataract, bilateral: Secondary | ICD-10-CM | POA: Diagnosis not present

## 2021-03-07 DIAGNOSIS — H5203 Hypermetropia, bilateral: Secondary | ICD-10-CM | POA: Diagnosis not present

## 2021-04-01 DIAGNOSIS — M9902 Segmental and somatic dysfunction of thoracic region: Secondary | ICD-10-CM | POA: Diagnosis not present

## 2021-04-01 DIAGNOSIS — M9901 Segmental and somatic dysfunction of cervical region: Secondary | ICD-10-CM | POA: Diagnosis not present

## 2021-04-01 DIAGNOSIS — M9903 Segmental and somatic dysfunction of lumbar region: Secondary | ICD-10-CM | POA: Diagnosis not present

## 2021-04-01 DIAGNOSIS — M9905 Segmental and somatic dysfunction of pelvic region: Secondary | ICD-10-CM | POA: Diagnosis not present

## 2021-04-01 DIAGNOSIS — M542 Cervicalgia: Secondary | ICD-10-CM | POA: Diagnosis not present

## 2021-04-01 DIAGNOSIS — M546 Pain in thoracic spine: Secondary | ICD-10-CM | POA: Diagnosis not present

## 2021-04-29 DIAGNOSIS — M546 Pain in thoracic spine: Secondary | ICD-10-CM | POA: Diagnosis not present

## 2021-04-29 DIAGNOSIS — M9902 Segmental and somatic dysfunction of thoracic region: Secondary | ICD-10-CM | POA: Diagnosis not present

## 2021-04-29 DIAGNOSIS — M9903 Segmental and somatic dysfunction of lumbar region: Secondary | ICD-10-CM | POA: Diagnosis not present

## 2021-04-29 DIAGNOSIS — M9905 Segmental and somatic dysfunction of pelvic region: Secondary | ICD-10-CM | POA: Diagnosis not present

## 2021-04-29 DIAGNOSIS — M9901 Segmental and somatic dysfunction of cervical region: Secondary | ICD-10-CM | POA: Diagnosis not present

## 2021-04-29 DIAGNOSIS — M542 Cervicalgia: Secondary | ICD-10-CM | POA: Diagnosis not present

## 2021-05-29 DIAGNOSIS — M546 Pain in thoracic spine: Secondary | ICD-10-CM | POA: Diagnosis not present

## 2021-05-29 DIAGNOSIS — M9901 Segmental and somatic dysfunction of cervical region: Secondary | ICD-10-CM | POA: Diagnosis not present

## 2021-05-29 DIAGNOSIS — M9903 Segmental and somatic dysfunction of lumbar region: Secondary | ICD-10-CM | POA: Diagnosis not present

## 2021-05-29 DIAGNOSIS — M9905 Segmental and somatic dysfunction of pelvic region: Secondary | ICD-10-CM | POA: Diagnosis not present

## 2021-05-29 DIAGNOSIS — M9902 Segmental and somatic dysfunction of thoracic region: Secondary | ICD-10-CM | POA: Diagnosis not present

## 2021-05-29 DIAGNOSIS — M542 Cervicalgia: Secondary | ICD-10-CM | POA: Diagnosis not present

## 2021-08-22 DIAGNOSIS — Z139 Encounter for screening, unspecified: Secondary | ICD-10-CM | POA: Diagnosis not present

## 2021-08-22 DIAGNOSIS — R7303 Prediabetes: Secondary | ICD-10-CM | POA: Diagnosis not present

## 2021-08-22 DIAGNOSIS — M790 Rheumatism, unspecified: Secondary | ICD-10-CM | POA: Diagnosis not present

## 2021-08-22 DIAGNOSIS — M797 Fibromyalgia: Secondary | ICD-10-CM | POA: Diagnosis not present

## 2021-08-22 DIAGNOSIS — F419 Anxiety disorder, unspecified: Secondary | ICD-10-CM | POA: Diagnosis not present

## 2021-08-22 DIAGNOSIS — E785 Hyperlipidemia, unspecified: Secondary | ICD-10-CM | POA: Diagnosis not present

## 2021-08-22 DIAGNOSIS — I10 Essential (primary) hypertension: Secondary | ICD-10-CM | POA: Diagnosis not present

## 2021-08-22 DIAGNOSIS — N959 Unspecified menopausal and perimenopausal disorder: Secondary | ICD-10-CM | POA: Diagnosis not present

## 2021-08-22 DIAGNOSIS — F320 Major depressive disorder, single episode, mild: Secondary | ICD-10-CM | POA: Diagnosis not present

## 2021-08-22 LAB — GLUCOSE, POCT (MANUAL RESULT ENTRY): POC Glucose: 103 mg/dl — AB (ref 70–99)

## 2021-09-11 DIAGNOSIS — Z1212 Encounter for screening for malignant neoplasm of rectum: Secondary | ICD-10-CM | POA: Diagnosis not present

## 2022-01-24 ENCOUNTER — Ambulatory Visit
Admission: EM | Admit: 2022-01-24 | Discharge: 2022-01-24 | Disposition: A | Discharge status: Home / Self Care | Payer: PPO | Attending: Physician Assistant | Admitting: Physician Assistant

## 2022-01-24 DIAGNOSIS — R051 Acute cough: Secondary | ICD-10-CM | POA: Diagnosis not present

## 2022-01-24 DIAGNOSIS — J069 Acute upper respiratory infection, unspecified: Secondary | ICD-10-CM | POA: Diagnosis not present

## 2022-01-24 DIAGNOSIS — B349 Viral infection, unspecified: Secondary | ICD-10-CM | POA: Diagnosis not present

## 2022-01-24 DIAGNOSIS — Z1152 Encounter for screening for COVID-19: Secondary | ICD-10-CM | POA: Diagnosis not present

## 2022-01-24 LAB — RESP PANEL BY RT-PCR (RSV, FLU A&B, COVID)  RVPGX2
Influenza A by PCR: NEGATIVE
Influenza B by PCR: NEGATIVE
Resp Syncytial Virus by PCR: NEGATIVE
SARS Coronavirus 2 by RT PCR: NEGATIVE

## 2022-01-24 MED ORDER — FLUTICASONE PROPIONATE 50 MCG/ACT NA SUSP: 2.0000 | Freq: Every day | NASAL | 2 refills | Status: AC

## 2022-01-24 MED ORDER — PREDNISONE 10 MG PO TABS: 10.0000 mg | ORAL_TABLET | Freq: Three times a day (TID) | ORAL | 0 refills | Status: AC

## 2022-01-24 NOTE — Discharge Instructions (Addendum)
The RSV test will be completed in 48 hours.  If you do not get a call from this office that indicates the test is negative.  Log onto MyChart to view the test results with the patient in 48 hours.  Advised to use Flonase nasal spray, 2 sprays each nostril once daily to help with the congestion and drainage. Advised to take the prednisone 10 mg 3 times a day for 3 days only as this will help reduce the respiratory inflammatory process.  Advised to follow-up with PCP or return to urgent care if symptoms fail to improve.

## 2022-01-24 NOTE — ED Provider Notes (Signed)
[facility]    CSN: [id number] Arrival date & time: 01/24/22  1351      History   Chief Complaint Chief Complaint  Patient presents with   URI    HPI Teresa Mcdaniel is a 66 y.o. female.   C45-year-old female presents with sinus congestion and cough.  Patient indicates for the past 3 weeks she has been having persistent sinus pressure and nasal congestion with clear production, postnasal drip which has been persistent, chest congestion with intermittent cough which has been clear production.  Patient indicates she has not had any fever, chills, wheezing, or shortness of breath.  Patient indicates a week ago she did COVID and flu test and these were negative.  Patient also indicates a week ago she finished a Z-Pak.  She relates that her symptoms has not improved and she continues with a lot of congestion despite using OTC medications.  Patient is concerned about possibly having RSV although she has not run any fever and not having any difficulty breathing.  Patient indicates that she does not have sense of taste or smell and this has been absent for the past 2 weeks.  Patient denies being around any family or friends that been sick.   URI Presenting symptoms: cough and rhinorrhea   Associated symptoms: sinus pain     Past Medical History:  Diagnosis Date   Allergic rhinitis    Anxiety    Fibromyalgia    Gastritis before 2000   Hyperlipidemia    Hypertension 05/05/2019   Interstitial cystitis    Mitral valve prolapse    Dx at time of pericarditis   Pericarditis 1990 or before   Rheumatism    Stenosis of right carotid artery     Patient Active Problem List   Diagnosis Date Noted   Chronic headache 07/14/2019   Hyperlipidemia 05/05/2019   Bilateral carotid artery stenosis 05/05/2019   Hypertension 05/05/2019   Chest pain radiating to jaw 09/23/2012   Mitral valve prolapse    h/o Pericarditis     Past Surgical History:  Procedure Laterality Date   ABDOMINAL  HYSTERECTOMY     CARDIAC CATHETERIZATION  Before 1990   Clean per patient    OB History   No obstetric history on file.      Home Medications    Prior to Admission medications   Medication Sig Start Date End Date Taking? Authorizing Provider  fluticasone (FLONASE) 50 MCG/ACT nasal spray Place 2 sprays into both nostrils daily. 01/24/22  Yes Ellsworth Lennox, PA-C  predniSONE (DELTASONE) 10 MG tablet Take 1 tablet (10 mg total) by mouth in the morning, at noon, and at bedtime. 01/24/22  Yes Ellsworth Lennox, PA-C  Ascorbic Acid (VITAMIN C PO) Take 1 tablet by mouth daily.    [provider]  Cholecalciferol (VITAMIN D-3 PO) Take 1 tablet by mouth daily.    [provider]  famotidine (PEPCID) 20 MG tablet Take 20 mg by mouth daily.    [provider]  fluticasone (FLONASE) 50 MCG/ACT nasal spray Place 2 sprays into both nostrils daily.    [provider]  GARLIC PO Take 1 tablet by mouth daily.    [provider]  loratadine (CLARITIN) 10 MG tablet Take 10 mg by mouth daily as needed for allergies.    [provider]  LORazepam (ATIVAN) 0.5 MG tablet Take 0.5 mg by mouth daily as needed. 03/07/19   [provider]  nortriptyline (PAMELOR) 10 MG capsule Take 1  capsule (10 mg total) by mouth at bedtime. 07/14/19   Glean Salvo, NP    Family History History reviewed. No pertinent family history.  Social History Social History   Tobacco Use   Smoking status: Never   Smokeless tobacco: Never  Substance Use Topics   Alcohol use: No   Drug use: No     Allergies   Codeine, Cortisone, Levaquin [levofloxacin in d5w], Lodine [etodolac], Nsaids, Tetracyclines & related, Zetia [ezetimibe], and Sulfa antibiotics   Review of Systems Review of Systems  HENT:  Positive for postnasal drip, rhinorrhea, sinus pressure and sinus pain.   Respiratory:  Positive for cough.      Physical Exam Triage Vital Signs ED Triage Vitals  Enc  Vitals Group     BP 01/24/22 1458 119/65     Pulse Rate 01/24/22 1458 63     Resp 01/24/22 1458 16     Temp 01/24/22 1458 (!) 97.4 F (36.3 C)     Temp src --      SpO2 01/24/22 1458 98 %     Weight --      Height --      Head Circumference --      Peak Flow --      Pain Score 01/24/22 1457 6     Pain Loc --      Pain Edu? --      Excl. in [place]? --    No data found.  Updated Vital Signs BP 119/65   Pulse 63   Temp (!) 97.4 F (36.3 C)   Resp 16   SpO2 98%   Visual Acuity Right Eye Distance:   Left Eye Distance:   Bilateral Distance:    Right Eye Near:   Left Eye Near:    Bilateral Near:     Physical Exam Constitutional:      Appearance: Normal appearance.  HENT:     Right Ear: Tympanic membrane and ear canal normal.     Left Ear: Tympanic membrane and ear canal normal.     Mouth/Throat:     Mouth: Mucous membranes are moist.     Pharynx: Oropharynx is clear.  Cardiovascular:     Rate and Rhythm: Normal rate and regular rhythm.     Heart sounds: Normal heart sounds.  Pulmonary:     Effort: Pulmonary effort is normal.     Breath sounds: Normal breath sounds and air entry. No wheezing, rhonchi or rales.  Lymphadenopathy:     Cervical: No cervical adenopathy.  Neurological:     Mental Status: She is alert.      UC Treatments / Results  Labs (all labs ordered are listed, but only abnormal results are displayed) Labs Reviewed  RESP PANEL BY RT-PCR (RSV, FLU A&B, COVID)  [id number]    EKG   Radiology No results found.  Procedures Procedures (including critical care time)  Medications Ordered in UC Medications - No data to display  Initial Impression / Assessment and Plan / UC Course  I have reviewed the triage vital signs and the nursing notes.  Pertinent labs & imaging results that were available during my care of the patient were reviewed by me and considered in my medical decision making (see chart for details).    Plan: 1.  The acute upper  respiratory infection be treated with the following: A.  Flonase nasal spray, 2 sprays each nostril once daily. 2.  The acute cough will be treated with the following: A.  Advised to use Flonase nasal spray, 2 sprays each nostril once a day to decrease the postnasal drip which is aggravated the cough. 3.  The acute viral syndrome be treated with the following: A.  Prednisone 10 mg 3 times a day for 3 days only to reduce the respiratory inflammatory process. 4.  Screening for COVID-19 will be treated with the following: A.  Treatment will be modified depending on the results of the COVID/flu/RSV test. 5.  Advised follow-up PCP or return to urgent care if symptoms fail to improve.  Final Clinical Impressions(s) / UC Diagnoses   Final diagnoses:  Acute upper respiratory infection  Acute cough  Acute viral syndrome  Encounter for screening for COVID-19     Discharge Instructions      The RSV test will be completed in 48 hours.  If you do not get a call from this office that indicates the test is negative.  Log onto MyChart to view the test results with the patient in 48 hours.  Advised to use Flonase nasal spray, 2 sprays each nostril once daily to help with the congestion and drainage. Advised to take the prednisone 10 mg 3 times a day for 3 days only as this will help reduce the respiratory inflammatory process.  Advised to follow-up with PCP or return to urgent care if symptoms fail to improve.    ED Prescriptions     Medication Sig Dispense Auth. Provider   fluticasone (FLONASE) 50 MCG/ACT nasal spray Place 2 sprays into both nostrils daily. 11.1 mL Ellsworth Lennox, PA-C   predniSONE (DELTASONE) 10 MG tablet Take 1 tablet (10 mg total) by mouth in the morning, at noon, and at bedtime. 10 tablet Ellsworth Lennox, PA-C      PDMP not reviewed this encounter.   Ellsworth Lennox, PA-C 01/24/22 1535

## 2022-01-24 NOTE — ED Triage Notes (Signed)
Pt presents to uc with co of cough congestion sinus pain and pressure pluresity for 3 weeks. Pt reports she has taken a z pack and multiple otc medications with minimal improvement.

## 2022-01-25 ENCOUNTER — Ambulatory Visit (HOSPITAL_COMMUNITY): Payer: Self-pay

## 2022-02-07 DIAGNOSIS — M546 Pain in thoracic spine: Secondary | ICD-10-CM | POA: Diagnosis not present

## 2022-02-07 DIAGNOSIS — M9901 Segmental and somatic dysfunction of cervical region: Secondary | ICD-10-CM | POA: Diagnosis not present

## 2022-02-07 DIAGNOSIS — M62830 Muscle spasm of back: Secondary | ICD-10-CM | POA: Diagnosis not present

## 2022-02-07 DIAGNOSIS — M9905 Segmental and somatic dysfunction of pelvic region: Secondary | ICD-10-CM | POA: Diagnosis not present

## 2022-02-07 DIAGNOSIS — M542 Cervicalgia: Secondary | ICD-10-CM | POA: Diagnosis not present

## 2022-02-07 DIAGNOSIS — M9902 Segmental and somatic dysfunction of thoracic region: Secondary | ICD-10-CM | POA: Diagnosis not present

## 2022-02-07 DIAGNOSIS — M9903 Segmental and somatic dysfunction of lumbar region: Secondary | ICD-10-CM | POA: Diagnosis not present

## 2022-02-21 DIAGNOSIS — M62830 Muscle spasm of back: Secondary | ICD-10-CM | POA: Diagnosis not present

## 2022-02-21 DIAGNOSIS — M9905 Segmental and somatic dysfunction of pelvic region: Secondary | ICD-10-CM | POA: Diagnosis not present

## 2022-02-21 DIAGNOSIS — M546 Pain in thoracic spine: Secondary | ICD-10-CM | POA: Diagnosis not present

## 2022-02-21 DIAGNOSIS — M9901 Segmental and somatic dysfunction of cervical region: Secondary | ICD-10-CM | POA: Diagnosis not present

## 2022-02-21 DIAGNOSIS — M542 Cervicalgia: Secondary | ICD-10-CM | POA: Diagnosis not present

## 2022-02-21 DIAGNOSIS — M9903 Segmental and somatic dysfunction of lumbar region: Secondary | ICD-10-CM | POA: Diagnosis not present

## 2022-02-21 DIAGNOSIS — M9902 Segmental and somatic dysfunction of thoracic region: Secondary | ICD-10-CM | POA: Diagnosis not present

## 2022-02-22 ENCOUNTER — Ambulatory Visit
Admission: EM | Admit: 2022-02-22 | Discharge: 2022-02-22 | Disposition: A | Discharge status: Home / Self Care | Payer: Medicare HMO | Attending: Internal Medicine | Admitting: Internal Medicine

## 2022-02-22 DIAGNOSIS — J3489 Other specified disorders of nose and nasal sinuses: Secondary | ICD-10-CM | POA: Diagnosis not present

## 2022-02-22 DIAGNOSIS — J029 Acute pharyngitis, unspecified: Secondary | ICD-10-CM | POA: Diagnosis not present

## 2022-02-22 DIAGNOSIS — R0981 Nasal congestion: Secondary | ICD-10-CM | POA: Insufficient documentation

## 2022-02-22 LAB — POCT RAPID STREP A (OFFICE): Rapid Strep A Screen: NEGATIVE

## 2022-02-22 MED ORDER — LIDOCAINE VISCOUS HCL 2 % MT SOLN: 5.0000 mL | Freq: Every day | OROMUCOSAL | 0 refills | Status: AC | PRN

## 2022-02-22 NOTE — ED Provider Notes (Signed)
[facility]    CSN: [id number] Arrival date & time: 02/22/22  1231      History   Chief Complaint Chief Complaint  Patient presents with   Facial Pain   Sore Throat    HPI Teresa Mcdaniel is a 67 y.o. female.   Patient presents with 66-month history of hoarseness, nasal congestion, sinus pressure.  She reports that she did have cough which is now resolved.  Also reports sore throat.  Patient was prescribed azithromycin at start of symptoms with minimal improvement.  She was then seen on 01/24/2022 at urgent care and prescribed prednisone with minimal improvement.  She has also taken Flonase and Claritin.  Patient reports that she took a 7-day course of doxycycline that she had gotten from her sister with minimal improvement as well.  She has an appointment with ENT specialist on 02/28/2021 for further evaluation.  No chest pain, shortness of breath, ear pain, nausea, vomiting, diarrhea, abdominal pain.  Patient does not report history of asthma or COPD and does not smoke cigarettes.   Sore Throat    Past Medical History:  Diagnosis Date   Allergic rhinitis    Anxiety    Fibromyalgia    Gastritis before 2000   Hyperlipidemia    Hypertension 05/05/2019   Interstitial cystitis    Mitral valve prolapse    Dx at time of pericarditis   Pericarditis 1990 or before   Rheumatism    Stenosis of right carotid artery     Patient Active Problem List   Diagnosis Date Noted   Chronic headache 07/14/2019   Hyperlipidemia 05/05/2019   Bilateral carotid artery stenosis 05/05/2019   Hypertension 05/05/2019   Chest pain radiating to jaw 09/23/2012   Mitral valve prolapse    h/o Pericarditis     Past Surgical History:  Procedure Laterality Date   ABDOMINAL HYSTERECTOMY     CARDIAC CATHETERIZATION  Before 1990   Clean per patient    OB History   No obstetric history on file.      Home Medications    Prior to Admission medications   Medication Sig Start Date End  Date Taking? Authorizing Provider  magic mouthwash (lidocaine, diphenhydrAMINE, alum & mag hydroxide) suspension Swish and spit 5 mLs daily as needed for mouth pain. 02/22/22  Yes Izell Labat, Hildred Alamin E, FNP  Ascorbic Acid (VITAMIN C PO) Take 1 tablet by mouth daily.    [provider]  Cholecalciferol (VITAMIN D-3 PO) Take 1 tablet by mouth daily.    [provider]  famotidine (PEPCID) 20 MG tablet Take 20 mg by mouth daily.    [provider]  fluticasone (FLONASE) 50 MCG/ACT nasal spray Place 2 sprays into both nostrils daily.    [provider]  fluticasone (FLONASE) 50 MCG/ACT nasal spray Place 2 sprays into both nostrils daily. 01/24/22   Nyoka Lint, PA-C  GARLIC PO Take 1 tablet by mouth daily.    [provider]  loratadine (CLARITIN) 10 MG tablet Take 10 mg by mouth daily as needed for allergies.    [provider]  LORazepam (ATIVAN) 0.5 MG tablet Take 0.5 mg by mouth daily as needed. 03/07/19   [provider]  nortriptyline (PAMELOR) 10 MG capsule Take 1 capsule (10 mg total) by mouth at bedtime. 07/14/19   Suzzanne Cloud, NP  predniSONE (DELTASONE) 10 MG tablet Take 1 tablet (10 mg total) by mouth in the morning, at noon, and at bedtime. 01/24/22   Jeneen Rinks,  Remo Lipps    Family History No family history on file.  Social History Social History   Tobacco Use   Smoking status: Never   Smokeless tobacco: Never  Substance Use Topics   Alcohol use: No   Drug use: No     Allergies   Codeine, Cortisone, Levaquin [levofloxacin in d5w], Lodine [etodolac], Nsaids, Tetracyclines & related, Zetia [ezetimibe], and Sulfa antibiotics   Review of Systems Review of Systems Per HPI  Physical Exam Triage Vital Signs ED Triage Vitals [02/22/22 1400]  Enc Vitals Group     BP 106/64     Pulse Rate 76     Resp 20     Temp 97.8 F (36.6 C)     Temp Source Oral     SpO2 98 %     Weight      Height      Head Circumference       Peak Flow      Pain Score 8     Pain Loc      Pain Edu?      Excl. in [place]?    No data found.  Updated Vital Signs BP 106/64   Pulse 76   Temp 97.8 F (36.6 C) (Oral)   Resp 20   SpO2 98%   Visual Acuity Right Eye Distance:   Left Eye Distance:   Bilateral Distance:    Right Eye Near:   Left Eye Near:    Bilateral Near:     Physical Exam Constitutional:      General: She is not in acute distress.    Appearance: Normal appearance. She is not toxic-appearing or diaphoretic.  HENT:     Head: Normocephalic and atraumatic.     Right Ear: Tympanic membrane and ear canal normal.     Left Ear: Tympanic membrane and ear canal normal.     Nose: Congestion present.     Mouth/Throat:     Mouth: Mucous membranes are moist.     Pharynx: Posterior oropharyngeal erythema present.  Eyes:     Extraocular Movements: Extraocular movements intact.     Conjunctiva/sclera: Conjunctivae normal.     Pupils: Pupils are equal, round, and reactive to light.  Cardiovascular:     Rate and Rhythm: Normal rate and regular rhythm.     Pulses: Normal pulses.     Heart sounds: Normal heart sounds.  Pulmonary:     Effort: Pulmonary effort is normal. No respiratory distress.     Breath sounds: Normal breath sounds. No stridor. No wheezing, rhonchi or rales.  Abdominal:     General: Abdomen is flat. Bowel sounds are normal.     Palpations: Abdomen is soft.  Musculoskeletal:        General: Normal range of motion.     Cervical back: Normal range of motion.  Skin:    General: Skin is warm and dry.  Neurological:     General: No focal deficit present.     Mental Status: She is alert and oriented to person, place, and time. Mental status is at baseline.  Psychiatric:        Mood and Affect: Mood normal.        Behavior: Behavior normal.      UC Treatments / Results  Labs (all labs ordered are listed, but only abnormal results are displayed) Labs Reviewed  CULTURE, GROUP A STREP [facility])   POCT RAPID STREP A (OFFICE)    EKG   Radiology No results  found.  Procedures Procedures (including critical care time)  Medications Ordered in UC Medications - No data to display  Initial Impression / Assessment and Plan / UC Course  I have reviewed the triage vital signs and the nursing notes.  Pertinent labs & imaging results that were available during my care of the patient were reviewed by me and considered in my medical decision making (see chart for details).     Patient presenting with 2 month history of nasal congestion and sinus pressure.  She denies cough and there are no adventitious lung sounds on exam so do not think that chest imaging is necessary.  Patient has already been on 2 courses of antibiotics, prednisone, Flonase, antihistamine, NSAID.  Therefore, I do think the best next step is for patient to see specialist given symptoms have been refractory to several different types of medications.  Do not think that additional antibiotic therapy is necessary at this time given the patient has been on 2 different types of antibiotics.  Discussed supportive care and symptom management until specialist appointment with ENT on 02/28/2021.  Strep was negative.  Throat culture pending.  Do not think any additional viral testing is necessary given duration of symptoms as it would not change treatment.  Patient requested Magic mouthwash for sore throat.  This may or may not be beneficial but will prescribe for patient given it may be helpful.  This was sent to the pharmacy.  Patient was given strict return precautions.  Patient verbalized understanding and was agreeable with plan. Final Clinical Impressions(s) / UC Diagnoses   Final diagnoses:  Nasal congestion  Sinus pressure  Sore throat     Discharge Instructions      Please follow-up with specialist for further evaluation and management of nasal congestion, sore throat, hoarseness, sinus pressure.  I have prescribed Magic  mouthwash which may be helpful with your sore throat.    ED Prescriptions     Medication Sig Dispense Auth. Provider   magic mouthwash (lidocaine, diphenhydrAMINE, alum & mag hydroxide) suspension Swish and spit 5 mLs daily as needed for mouth pain. 360 mL Gustavus Bryant, [place]      PDMP not reviewed this encounter.   Gustavus Bryant, [place] 02/23/22 [phone]

## 2022-02-22 NOTE — Discharge Instructions (Signed)
Please follow-up with specialist for further evaluation management of nasal congestion, sore throat, hoarseness, sinus pressure.  I have prescribed Magic mouthwash which may be helpful with your sore throat.

## 2022-02-22 NOTE — ED Triage Notes (Addendum)
Pt presents to uc with co of congestion, hoarsness, and sinus pressure for 2 months.  Pt was recently treated with a z pack and then prednisone but symptoms persist. Pt reports she used her sisters doxycycline with no improvement she is using Flonase, aleeve, zyrtec, and hydroxyzine, and musinex. Pt has ent specialist appointment in feb.

## 2022-02-25 LAB — CULTURE, GROUP A STREP (THRC)

## 2022-02-28 DIAGNOSIS — K219 Gastro-esophageal reflux disease without esophagitis: Secondary | ICD-10-CM | POA: Diagnosis not present

## 2022-02-28 DIAGNOSIS — J301 Allergic rhinitis due to pollen: Secondary | ICD-10-CM | POA: Diagnosis not present

## 2022-02-28 DIAGNOSIS — R490 Dysphonia: Secondary | ICD-10-CM | POA: Diagnosis not present

## 2022-02-28 DIAGNOSIS — R1314 Dysphagia, pharyngoesophageal phase: Secondary | ICD-10-CM | POA: Diagnosis not present

## 2022-04-07 DIAGNOSIS — K219 Gastro-esophageal reflux disease without esophagitis: Secondary | ICD-10-CM | POA: Diagnosis not present

## 2022-04-14 DIAGNOSIS — Z79899 Other long term (current) drug therapy: Secondary | ICD-10-CM | POA: Diagnosis not present

## 2022-05-01 DIAGNOSIS — I6523 Occlusion and stenosis of bilateral carotid arteries: Secondary | ICD-10-CM | POA: Diagnosis not present

## 2022-05-01 DIAGNOSIS — R7303 Prediabetes: Secondary | ICD-10-CM | POA: Diagnosis not present

## 2022-05-01 DIAGNOSIS — I10 Essential (primary) hypertension: Secondary | ICD-10-CM | POA: Diagnosis not present

## 2022-05-01 DIAGNOSIS — Z1211 Encounter for screening for malignant neoplasm of colon: Secondary | ICD-10-CM | POA: Diagnosis not present

## 2022-05-01 DIAGNOSIS — Z0289 Encounter for other administrative examinations: Secondary | ICD-10-CM | POA: Diagnosis not present

## 2022-05-01 DIAGNOSIS — I341 Nonrheumatic mitral (valve) prolapse: Secondary | ICD-10-CM | POA: Diagnosis not present

## 2022-05-01 DIAGNOSIS — I700 Atherosclerosis of aorta: Secondary | ICD-10-CM | POA: Diagnosis not present

## 2022-05-01 DIAGNOSIS — Z1231 Encounter for screening mammogram for malignant neoplasm of breast: Secondary | ICD-10-CM | POA: Diagnosis not present

## 2022-05-01 DIAGNOSIS — Z8679 Personal history of other diseases of the circulatory system: Secondary | ICD-10-CM | POA: Diagnosis not present

## 2022-05-01 DIAGNOSIS — E785 Hyperlipidemia, unspecified: Secondary | ICD-10-CM | POA: Diagnosis not present

## 2022-05-01 DIAGNOSIS — Z13820 Encounter for screening for osteoporosis: Secondary | ICD-10-CM | POA: Diagnosis not present

## 2022-05-02 DIAGNOSIS — M9902 Segmental and somatic dysfunction of thoracic region: Secondary | ICD-10-CM | POA: Diagnosis not present

## 2022-05-02 DIAGNOSIS — M546 Pain in thoracic spine: Secondary | ICD-10-CM | POA: Diagnosis not present

## 2022-05-02 DIAGNOSIS — M9901 Segmental and somatic dysfunction of cervical region: Secondary | ICD-10-CM | POA: Diagnosis not present

## 2022-05-02 DIAGNOSIS — M542 Cervicalgia: Secondary | ICD-10-CM | POA: Diagnosis not present

## 2022-05-02 DIAGNOSIS — M9903 Segmental and somatic dysfunction of lumbar region: Secondary | ICD-10-CM | POA: Diagnosis not present

## 2022-05-06 DIAGNOSIS — Z1211 Encounter for screening for malignant neoplasm of colon: Secondary | ICD-10-CM | POA: Diagnosis not present

## 2022-05-15 DIAGNOSIS — Z1231 Encounter for screening mammogram for malignant neoplasm of breast: Secondary | ICD-10-CM | POA: Diagnosis not present

## 2022-05-15 DIAGNOSIS — Z13820 Encounter for screening for osteoporosis: Secondary | ICD-10-CM | POA: Diagnosis not present

## 2022-05-15 DIAGNOSIS — M810 Age-related osteoporosis without current pathological fracture: Secondary | ICD-10-CM | POA: Diagnosis not present

## 2022-05-15 DIAGNOSIS — R92323 Mammographic fibroglandular density, bilateral breasts: Secondary | ICD-10-CM | POA: Diagnosis not present

## 2022-05-29 DIAGNOSIS — Z0289 Encounter for other administrative examinations: Secondary | ICD-10-CM | POA: Diagnosis not present

## 2022-05-29 DIAGNOSIS — K219 Gastro-esophageal reflux disease without esophagitis: Secondary | ICD-10-CM | POA: Diagnosis not present

## 2022-05-29 DIAGNOSIS — M810 Age-related osteoporosis without current pathological fracture: Secondary | ICD-10-CM | POA: Diagnosis not present

## 2022-06-16 ENCOUNTER — Other Ambulatory Visit: Payer: Self-pay

## 2023-12-01 LAB — COLOGUARD: COLOGUARD: NEGATIVE
